# Patient Record
Sex: Female | Born: 1970 | Race: Black or African American | Hispanic: No | Smoking: Current every day smoker
Health system: Southern US, Community
[De-identification: ages and names within clinical notes are randomized; demographics above are authoritative.]

## PROBLEM LIST (undated history)

## (undated) DIAGNOSIS — T7840XA Allergy, unspecified, initial encounter: Secondary | ICD-10-CM

## (undated) DIAGNOSIS — J302 Other seasonal allergic rhinitis: Secondary | ICD-10-CM

## (undated) HISTORY — PX: PILONIDAL CYST EXCISION: SHX744

## (undated) HISTORY — PX: WISDOM TOOTH EXTRACTION: SHX21

## (undated) HISTORY — PX: CHOLECYSTECTOMY: SHX55

## (undated) HISTORY — DX: Allergy, unspecified, initial encounter: T78.40XA

---

## 1999-05-17 ENCOUNTER — Other Ambulatory Visit: Admission: RE | Admit: 1999-05-17 | Discharge: 1999-05-17 | Payer: Self-pay | Admitting: Gynecology

## 1999-05-23 ENCOUNTER — Encounter: Payer: Self-pay | Admitting: Obstetrics and Gynecology

## 1999-05-23 ENCOUNTER — Ambulatory Visit (HOSPITAL_COMMUNITY): Admission: RE | Admit: 1999-05-23 | Discharge: 1999-05-23 | Payer: Self-pay | Admitting: Obstetrics and Gynecology

## 1999-10-02 ENCOUNTER — Ambulatory Visit (HOSPITAL_COMMUNITY): Admission: RE | Admit: 1999-10-02 | Discharge: 1999-10-02 | Payer: Self-pay | Admitting: Obstetrics and Gynecology

## 2000-01-08 ENCOUNTER — Inpatient Hospital Stay (HOSPITAL_COMMUNITY): Admission: AD | Admit: 2000-01-08 | Discharge: 2000-01-11 | Payer: Self-pay | Admitting: Obstetrics & Gynecology

## 2000-01-12 ENCOUNTER — Encounter: Admission: RE | Admit: 2000-01-12 | Discharge: 2000-04-11 | Payer: Self-pay | Admitting: Obstetrics & Gynecology

## 2000-04-13 ENCOUNTER — Encounter: Admission: RE | Admit: 2000-04-13 | Discharge: 2000-06-12 | Payer: Self-pay | Admitting: Obstetrics & Gynecology

## 2000-07-11 ENCOUNTER — Encounter: Admission: RE | Admit: 2000-07-11 | Discharge: 2000-08-10 | Payer: Self-pay | Admitting: Obstetrics & Gynecology

## 2000-08-11 ENCOUNTER — Encounter: Admission: RE | Admit: 2000-08-11 | Discharge: 2000-09-10 | Payer: Self-pay | Admitting: Obstetrics & Gynecology

## 2000-10-11 ENCOUNTER — Encounter: Admission: RE | Admit: 2000-10-11 | Discharge: 2000-11-10 | Payer: Self-pay | Admitting: Obstetrics & Gynecology

## 2000-11-11 ENCOUNTER — Encounter: Admission: RE | Admit: 2000-11-11 | Discharge: 2000-12-11 | Payer: Self-pay | Admitting: Obstetrics & Gynecology

## 2003-06-06 ENCOUNTER — Other Ambulatory Visit: Admission: RE | Admit: 2003-06-06 | Discharge: 2003-06-06 | Payer: Self-pay | Admitting: Family Medicine

## 2003-06-16 ENCOUNTER — Ambulatory Visit (HOSPITAL_COMMUNITY): Admission: RE | Admit: 2003-06-16 | Discharge: 2003-06-16 | Payer: Self-pay | Admitting: General Surgery

## 2003-06-16 ENCOUNTER — Encounter (INDEPENDENT_AMBULATORY_CARE_PROVIDER_SITE_OTHER): Payer: Self-pay | Admitting: *Deleted

## 2005-09-25 ENCOUNTER — Other Ambulatory Visit: Admission: RE | Admit: 2005-09-25 | Discharge: 2005-09-25 | Payer: Self-pay | Admitting: Family Medicine

## 2008-09-22 ENCOUNTER — Emergency Department (HOSPITAL_COMMUNITY): Admission: EM | Admit: 2008-09-22 | Discharge: 2008-09-22 | Payer: Self-pay | Admitting: Emergency Medicine

## 2010-03-17 ENCOUNTER — Emergency Department (HOSPITAL_COMMUNITY)
Admission: EM | Admit: 2010-03-17 | Discharge: 2010-03-17 | Payer: Self-pay | Source: Home / Self Care | Admitting: Family Medicine

## 2010-03-25 LAB — POCT URINALYSIS DIPSTICK
Bilirubin Urine: NEGATIVE
Ketones, ur: NEGATIVE mg/dL
Nitrite: NEGATIVE
Protein, ur: NEGATIVE mg/dL
Specific Gravity, Urine: 1.025 (ref 1.005–1.030)
Urine Glucose, Fasting: NEGATIVE mg/dL
Urobilinogen, UA: 0.2 mg/dL (ref 0.0–1.0)
pH: 6.5 (ref 5.0–8.0)

## 2010-03-25 LAB — URINE CULTURE
Colony Count: 3000
Culture  Setup Time: 201201082208

## 2010-03-25 LAB — POCT PREGNANCY, URINE: Preg Test, Ur: NEGATIVE

## 2010-03-26 ENCOUNTER — Emergency Department (HOSPITAL_COMMUNITY)
Admission: EM | Admit: 2010-03-26 | Discharge: 2010-03-26 | Payer: Self-pay | Source: Home / Self Care | Admitting: Family Medicine

## 2010-04-01 LAB — CULTURE, ROUTINE-ABSCESS

## 2010-06-10 ENCOUNTER — Observation Stay (HOSPITAL_COMMUNITY)
Admission: EM | Admit: 2010-06-10 | Discharge: 2010-06-13 | Disposition: A | Discharge status: Home / Self Care | Payer: BC Managed Care – PPO | Attending: General Surgery | Admitting: General Surgery

## 2010-06-10 ENCOUNTER — Emergency Department (HOSPITAL_COMMUNITY): Payer: BC Managed Care – PPO

## 2010-06-10 DIAGNOSIS — Z79899 Other long term (current) drug therapy: Secondary | ICD-10-CM | POA: Insufficient documentation

## 2010-06-10 DIAGNOSIS — E669 Obesity, unspecified: Secondary | ICD-10-CM | POA: Insufficient documentation

## 2010-06-10 DIAGNOSIS — F17200 Nicotine dependence, unspecified, uncomplicated: Secondary | ICD-10-CM | POA: Insufficient documentation

## 2010-06-10 DIAGNOSIS — Z23 Encounter for immunization: Secondary | ICD-10-CM | POA: Insufficient documentation

## 2010-06-10 DIAGNOSIS — K8010 Calculus of gallbladder with chronic cholecystitis without obstruction: Principal | ICD-10-CM | POA: Insufficient documentation

## 2010-06-10 LAB — URINALYSIS, ROUTINE W REFLEX MICROSCOPIC
Bilirubin Urine: NEGATIVE
Glucose, UA: NEGATIVE mg/dL
Ketones, ur: NEGATIVE mg/dL
Leukocytes, UA: NEGATIVE
Nitrite: NEGATIVE
Protein, ur: NEGATIVE mg/dL
Specific Gravity, Urine: 1.021 (ref 1.005–1.030)
Urobilinogen, UA: 0.2 mg/dL (ref 0.0–1.0)
pH: 7 (ref 5.0–8.0)

## 2010-06-10 LAB — COMPREHENSIVE METABOLIC PANEL
ALT: 9 U/L (ref 0–35)
AST: 15 U/L (ref 0–37)
Albumin: 3.5 g/dL (ref 3.5–5.2)
Alkaline Phosphatase: 55 U/L (ref 39–117)
BUN: 7 mg/dL (ref 6–23)
CO2: 29 mEq/L (ref 19–32)
Calcium: 8.9 mg/dL (ref 8.4–10.5)
Chloride: 101 mEq/L (ref 96–112)
Creatinine, Ser: 0.86 mg/dL (ref 0.4–1.2)
GFR calc Af Amer: >60 mL/min (ref >60)
GFR calc non Af Amer: >60 mL/min (ref >60)
Glucose, Bld: 94 mg/dL (ref 70–99)
Potassium: 3.8 mEq/L (ref 3.5–5.1)
Sodium: 138 mEq/L (ref 135–145)
Total Bilirubin: 0.5 mg/dL (ref 0.3–1.2)
Total Protein: 7.5 g/dL (ref 6.0–8.3)

## 2010-06-10 LAB — DIFFERENTIAL
Basophils Absolute: 0 10*3/uL (ref 0.0–0.1)
Basophils Relative: 1 % (ref 0–1)
Eosinophils Absolute: 0.2 10*3/uL (ref 0.0–0.7)
Eosinophils Relative: 2 % (ref 0–5)
Lymphocytes Relative: 36 % (ref 12–46)
Lymphs Abs: 3.1 10*3/uL (ref 0.7–4.0)
Monocytes Absolute: 0.6 10*3/uL (ref 0.1–1.0)
Monocytes Relative: 7 % (ref 3–12)
Neutro Abs: 4.8 10*3/uL (ref 1.7–7.7)
Neutrophils Relative %: 55 % (ref 43–77)

## 2010-06-10 LAB — CBC
HCT: 41.2 % (ref 36.0–46.0)
Hemoglobin: 14.2 g/dL (ref 12.0–15.0)
MCH: 29.2 pg (ref 26.0–34.0)
MCHC: 34.5 g/dL (ref 30.0–36.0)
MCV: 84.6 fL (ref 78.0–100.0)
Platelets: 380 10*3/uL (ref 150–400)
RBC: 4.87 MIL/uL (ref 3.87–5.11)
RDW: 12.2 % (ref 11.5–15.5)
WBC: 8.7 10*3/uL (ref 4.0–10.5)

## 2010-06-10 LAB — LIPASE, BLOOD: Lipase: 47 U/L (ref 11–59)

## 2010-06-10 LAB — URINE MICROSCOPIC-ADD ON

## 2010-06-10 LAB — POCT PREGNANCY, URINE: Preg Test, Ur: NEGATIVE

## 2010-06-12 ENCOUNTER — Other Ambulatory Visit: Payer: Self-pay | Admitting: General Surgery

## 2010-06-12 ENCOUNTER — Observation Stay (HOSPITAL_COMMUNITY): Payer: BC Managed Care – PPO

## 2010-06-12 LAB — SURGICAL PCR SCREEN
MRSA, PCR: NEGATIVE
Staphylococcus aureus: POSITIVE — AB

## 2010-06-15 NOTE — Op Note (Signed)
Lorraine [name], [name]                ACCOUNT NO.:  [id number]  MEDICAL RECORD NO.:  [id number]           PATIENT TYPE:  O  LOCATION:  5120                         FACILITY:  [facility]  PHYSICIAN:  Lorraine [name], MDDATE OF BIRTH:  [birth year]  DATE OF PROCEDURE:  06/12/2010 DATE OF DISCHARGE:                              OPERATIVE REPORT   PREOPERATIVE DIAGNOSIS:  Biliary colic.  POSTOPERATIVE DIAGNOSIS:  Chronic cholecystitis.  PROCEDURE:  Laparoscopic cholecystectomy and intraoperative cholangiogram.  SURGEON:  Lorraine Gosling, MD  ASSISTANT:  Brayton El, PA-C  ANESTHESIA:  General.  SPECIMENS:  Gallbladder and contents to pathology.  ESTIMATED BLOOD LOSS:  Minimal.  COMPLICATIONS:  None.  DRAINS:  None.  DISPOSITION:  To recovery room in stable condition.  INDICATIONS:  This is a 40 year old female with right upper quadrant pain over the last couple of [place].  Her liver LFTs and lipase were both normal.  She has an ultrasound that shows a stone impacted in her neck. We discussed laparoscopic cholecystectomy.  PROCEDURE:  After informed consent was obtained, the patient was placed on ciprofloxacin on the floor due to penicillin allergy.  She had sequential compression devices placed on lower extremities prior to induction of anesthesia.  She was then placed under general endotracheal anesthesia without complication.  Her abdomen was prepped and draped in standard sterile surgical fashion.  Surgical time-out was then performed.  Marcaine 0.25% was infiltrated below the umbilicus.  I then made a vertical incision and dissected down to the umbilical stock.  This was grasped.  I then entered the fascia sharply.  The peritoneum was entered bluntly.  There was no evidence of an entry injury.  I then placed a 0- Vicryl pursestring suture through the fascia.  I then insufflated the abdomen at 15 mmHg.  The three further 5-mm trocars were placed in the epigastrium  right upper quadrant under direct vision after infiltration of local anesthetic without complication.  Gallbladder was then retracted cephalad and lateral.  There was a fair amount of scarring around her neck of her gallbladder where the stone was impacted.  With careful dissection, I eventually was able to identify what I thought was the cystic duct.  The right hepatic artery had a caterpillar hump and an anatomical variant, it was adherent to both the gallbladder as well as the cystic duct.  I eventually was able to dissect the cystic duct from this.  Before I decided to clip and divide this, I did perform a cholangiogram.  I placed a clip distal, introduced a Cook catheter, and then did a cholangiogram.  This showed that I was in the cystic duct, it showed filling of both sides of the liver and flow into the duodenum. Following this, I removed the catheter, clipped the duct and divided it. With some additional work, I then was able to eventually free the right hepatic artery from the gallbladder and was able to identify the cystic artery coming right off the right hepatic artery.  This was ligated with clips and divided.  Then the gallbladder was removed from the liver bed. There was evidence of  chronic and then some acute cholecystitis with removing this with a very thick rind around the gallbladder and a fairly watery plane with the liver.  This eventually was removed, placed in an EndoCatch bag, and removed from the umbilicus.  Hemostasis was then obtained.  Copious irrigation was performed.  I placed a piece of Surgicel snow in the bed.  Irrigant was evacuated.  I then observed the umbilical incision from the epigastric port.  This was tied down, the defect appeared to be obliterated, all the trocars were removed and the abdomen was desufflated.  The incisions were then closed with 4-0 Monocryl.  Dermabond was placed over this.  She tolerated this well, was extubated in the  operating room, and transferred to the recovery room in a stable condition.     Lorraine Gosling, MD     MCW/MEDQ  D:  06/12/2010  T:  06/13/2010  Job:  [id number]  Electronically Signed by Emelia Loron MD on 06/15/2010 09:33:38 AM

## 2010-06-24 NOTE — H&P (Signed)
Lorraine [name], Lorraine [name]                ACCOUNT NO.:  [id number]  MEDICAL RECORD NO.:  [id number]           PATIENT TYPE:  O  LOCATION:  5120                         FACILITY:  [facility]  PHYSICIAN:  Juanetta Gosling, MDDATE OF BIRTH:  [birth year]  DATE OF ADMISSION:  06/10/2010 DATE OF DISCHARGE:                             HISTORY & PHYSICAL   TIME OF ADMISSION:  1330 p.m.  CHIEF COMPLAINT:  Abdominal pain.  HISTORY OF PRESENT ILLNESS:  Ms. Muldrow is a very pleasant 40 year old obese black female who has no past medical history.  She developed epigastric abdominal pain yesterday morning.  Throughout the day, it faded and went away.  However, last night, it came back.  She describes this as epigastric, nagging, dull, and constant pain.  She did have some nausea yesterday, but no emesis until this morning.  She denies any fevers or chills.  She denies any prior episodes.  Because of worsening pain, which has now resolved, the patient presented to the emergency department.  Upon arrival, she had a ultrasound of abdomen completed which revealed gallbladder wall thickening that was edematous and measuring 5.4 mm.  She also had a stone lodged in the neck of her gallbladder that measured 1.8 cm.  Because of this, we were asked to evaluate the patient for admission.  FAMILY HISTORY:  Noncontributory.  PAST MEDICAL HISTORY:  Obesity.  PAST SURGICAL HISTORY:  One pilonidal cyst removal.  SOCIAL HISTORY:  The patient is single.  She has one child.  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  She admits to 6-7 cigarettes a day, social alcohol, but no illicit drug abuse.  ALLERGIES:  PENICILLIN.  MEDICATIONS: 1. Phentermine 37.5 mg half a tablet daily. 2. Forskohlii. 3. Raspberry ketone. 4. Fexofenadine. 5. Multivitamin.  PHYSICAL EXAM:  GENERAL:  Ms. Rochon is a pleasant obese 40 year old black female who is currently sitting up in bed in no acute distress. VITAL SIGNS:   Temperature 98.6, pulse 85, respirations 20, blood pressure 116/78. HEENT:  Head is normocephalic, atraumatic.  Sclerae noninjected.  Pupils are equal, round, and reactive to light.  Ears and nose without any obvious masses or lesions.  No rhinorrhea.  Mouth is pink.  Throat shows no exudate. HEART:  Regular rate and rhythm.  Normal S1, S2.  No murmurs, gallops, or rubs are noted.  She does have palpable carotid, radial, and pedal pulses bilaterally. LUNGS:  Clear to auscultation bilaterally with no wheezes, rhonchi, or rales noted.  Respiratory effort is nonlabored. ABDOMEN:  Soft, nontender, nondistended with active bowel sounds.  No masses, hernias, or organomegaly noted.  The patient is obese. MUSCULOSKELETAL:  All four extremities are symmetrical.  No cyanosis, clubbing, or edema. PSYCH:  The patient is alert and oriented x3 with an appropriate affect.  LABS AND DIAGNOSTICS:  White blood cell count is 8700, hemoglobin 14.2, hematocrit 41.2, platelet count is 380,000.  Sodium 138, potassium 3.8, glucose 94, BUN 7, creatinine 0.86, AST 18, ALT 5, alkaline phosphatase 55, total bilirubin 0.5, lipase is 47.  Ultrasound reveals a 1.8 cm stone lodged in the neck of  the gallbladder.  She has gallbladder wall thickening and edema that is 5.4 mm.  IMPRESSION: 1. Biliary colic/early acute cholecystitis. 2. Obesity.  PLAN:  At this time, we will plan on admitting the patient and proceeding with a laparoscopic cholecystectomy pending no other emergency cases.  We will give her Cipro on-call to the OR.  We will make her n.p.o. and start her on various IV fluids and medicines as needed for pain and nausea.  I have discussed the procedure along with risks and benefits of the surgery to the patient.  She understands and wishes to proceed.     Letha Cape, PA   ______________________________ Juanetta Gosling, MD    KEO/MEDQ  D:  06/10/2010  T:  06/11/2010  Job:   [id number]  Electronically Signed by Barnetta Chapel PA on 06/21/2010 01:05:18 PM Electronically Signed by Emelia Loron MD on 06/23/2010 08:22:15 PM

## 2010-07-08 ENCOUNTER — Encounter (INDEPENDENT_AMBULATORY_CARE_PROVIDER_SITE_OTHER): Payer: Self-pay | Admitting: General Surgery

## 2010-07-11 NOTE — Discharge Summary (Signed)
  Lorraine [name]                ACCOUNT NO.:  [id number]  MEDICAL RECORD NO.:  [id number]           PATIENT TYPE:  O  LOCATION:  5120                         FACILITY:  [facility]  PHYSICIAN:  Lorraine [name], MDDATE OF BIRTH:  07-03-1970  DATE OF ADMISSION:  06/10/2010 DATE OF DISCHARGE:  06/13/2010                              DISCHARGE SUMMARY   HISTORY OF PRESENT ILLNESS:  Lorraine [name] is a 40 year old obese black female who presented with epigastric pain.  She was seen in the emergency department and had a workup including ultrasound and labs. Her ultrasound revealed a large stone lodged in the neck of the gallbladder and mild gallbladder wall thickening consistent with early cholecystitis.  Decision was made at that point to admit the patient for ongoing management.  SUMMARY OF HOSPITAL COURSE:  The patient was admitted on June 10, 2010, was then subsequently taken to the operating room on June 12, 2010, and underwent laparoscopic cholecystectomy with intraoperative cholangiogram, which was negative.  The patient tolerated the procedure well and was stable for discharge.  No significant postoperative complications occurred.  The patient tolerated regular diet and as of June 13, 2010, was ready for discharge home.  DISCHARGE DIAGNOSIS:  Acute cholecystitis status post laparoscopic cholecystectomy.  PLAN:  Home Health.  MEDICATIONS:  The patient will continue her phentermine and multivitamins and supplements.  She will also be given prescription for Vicodin 1-2 tablets q.6 h. p.r.n. pain.  She is given preprinted discharge instructions to follow and come back in our office in approximately 2 [place]' time.     Lorraine El, PA-C   ______________________________ Lorraine Gosling, MD    KB/MEDQ  D:  07/01/2010  T:  07/02/2010  Job:  [id number]  Electronically Signed by Lorraine [name]  on 07/11/2010 09:04:26 AM Electronically Signed by Emelia Loron MD on  07/11/2010 07:09:43 PM

## 2010-07-26 NOTE — Op Note (Signed)
NAMEBAYLA, MCGOVERN                            ACCOUNT NO.:  [id number]   MEDICAL RECORD NO.:  [id number]                   PATIENT TYPE:  OIB   LOCATION:  2899                                 FACILITY:  [facility]   PHYSICIAN:  Leonie Man, M.D.                DATE OF BIRTH:  [birth year]   DATE OF PROCEDURE:  06/16/2003  DATE OF DISCHARGE:                                 OPERATIVE REPORT   PREOPERATIVE DIAGNOSIS:  Pilonidal cyst and abscess.   POSTOPERATIVE DIAGNOSIS:  Pilonidal cyst and abscess.   PROCEDURE:  Excision and marsupialization of pilonidal cyst.   SURGEON:  Leonie Man, M.D.   ASSISTANT:  Nurse.   ANESTHESIA:  General.   The patient is a 40 year old morbidly obese female with recurrent episodes  of infection through a pilonidal cyst.  She has a chronic draining sinus.  She comes to the operating room after the risks and potential benefits of  surgery have been fully discussed, all questions answered, and consent  obtained.   PROCEDURE:  Following the induction of anesthesia, the patient is placed in  the prone position, the buttock cheeks are spread, and the area of the lower  lumbar and coccygeal area is prepped and draped to be included in a sterile  operative field.  The area of induration and cyst extended approximately 4  cm across transversely and extended down to near to the tip of the coccyx.  A long elliptical incision incorporating all this was carried down through  the skin and subcutaneous tissues, carrying it down to the presacral fascia.  The entire area was removed and forwarded for pathologic evaluation.  Hemostasis was assured with electric cautery.  The dermis was then tacked  down to the presacral fascia with interrupted sutures of 0 chromic catgut.  The wound was then packed with Xeroform gauze, sterile dressings were  applied, the sponge, instrument, and sharp counts verified, the patient  removed from the operating room to the recovery room  in stable condition.  She tolerated the procedure well.                                               Leonie Man, M.D.    PB/MEDQ  D:  06/16/2003  T:  06/16/2003  Job:  [id number]   cc:   Renaye Rakers, M.D.  [phone] [address], [address]  [place]  [place] [address]  Fax: [phone]

## 2011-04-01 ENCOUNTER — Encounter: Payer: Self-pay | Admitting: Family Medicine

## 2011-04-01 ENCOUNTER — Ambulatory Visit (INDEPENDENT_AMBULATORY_CARE_PROVIDER_SITE_OTHER): Payer: BC Managed Care – PPO | Admitting: Family Medicine

## 2011-04-01 ENCOUNTER — Ambulatory Visit: Payer: Self-pay | Admitting: Family Medicine

## 2011-04-01 VITALS — BP 110/72 | HR 87 | Resp 16 | Ht 68.5 in | Wt 309.1 lb

## 2011-04-01 DIAGNOSIS — Z131 Encounter for screening for diabetes mellitus: Secondary | ICD-10-CM

## 2011-04-01 DIAGNOSIS — Z720 Tobacco use: Secondary | ICD-10-CM

## 2011-04-01 DIAGNOSIS — L732 Hidradenitis suppurativa: Secondary | ICD-10-CM

## 2011-04-01 DIAGNOSIS — E669 Obesity, unspecified: Secondary | ICD-10-CM

## 2011-04-01 DIAGNOSIS — L0292 Furuncle, unspecified: Secondary | ICD-10-CM

## 2011-04-01 DIAGNOSIS — L0293 Carbuncle, unspecified: Secondary | ICD-10-CM

## 2011-04-01 DIAGNOSIS — Z13220 Encounter for screening for lipoid disorders: Secondary | ICD-10-CM

## 2011-04-01 DIAGNOSIS — F17200 Nicotine dependence, unspecified, uncomplicated: Secondary | ICD-10-CM

## 2011-04-01 NOTE — Patient Instructions (Signed)
Set an appt for your PAP Smear Get your labs done before the next visit- fasting Work on your work out plan Try veggies/fruit with each meal Eat at least 3 meals a day, with snacks in between Use the electronic cigarrette

## 2011-04-01 NOTE — Progress Notes (Signed)
  Subjective:    Patient ID: Lorraine [name], female    DOB: [birth year], 41 y.o.   MRN: [id number]  HPI   Pt here to establish care , last PCP Dr. Valentina Lucks at [facility] in [place]   Recent Cholecystecomy in April 2012- no current abdominal pain    No history of HTN, DM, Hyperlipidemia, Migraine, lung disease   Recurrent Boils- occur in axilla, groin, breast and on abdomen     Right breast abscess- Feb 2012 treated at urgernt care, continues to have discharge at the site of the abscess that is thick intermittanly, no nipple discharge  No recent PAP Smear- no history of abnormal PAP smears  LMP- Mar 08, 2011, regular    No eye doctor or dentist   No flu shot  +Tetanus shots -- 2012  Review of Systems GEN- denies fatigue, fever, weight loss,weakness, recent illness HEENT- denies eye drainage, change in vision, nasal discharge, CVS- denies chest pain, palpitations RESP- denies SOB, cough, wheeze ABD- denies N/V, loose stools, abd pain, +cramping GU- denies dysuria, hematuria, dribbling, incontinence MSK- denies joint pain, muscle aches, injury Neuro- denies headache, dizziness, syncope, seizure activity          Objective:   Physical Exam GEN- NAD, alert and oriented x3, obese HEENT- PERRL, EOMI, non injected sclera, pink conjunctiva, MMM, oropharynx clear Neck- Supple, no thryomegaly CVS- RRR, no murmur ABD- NABS, soft , NT, ND RESP-CTAB EXT- No edema Pulses- Radial, DP- 2+ Skin- healing boil in axilla, open pit in left axilla- no pus expressed, multiple scars on abdomen , chest wall from boils  Left axilla- pit seen      Assessment & Plan:

## 2011-04-02 DIAGNOSIS — L0293 Carbuncle, unspecified: Secondary | ICD-10-CM | POA: Insufficient documentation

## 2011-04-02 DIAGNOSIS — E669 Obesity, unspecified: Secondary | ICD-10-CM | POA: Insufficient documentation

## 2011-04-02 DIAGNOSIS — Z720 Tobacco use: Secondary | ICD-10-CM | POA: Insufficient documentation

## 2011-04-02 DIAGNOSIS — E66813 Obesity, class 3: Secondary | ICD-10-CM

## 2011-04-02 DIAGNOSIS — L732 Hidradenitis suppurativa: Secondary | ICD-10-CM

## 2011-04-02 HISTORY — DX: Hidradenitis suppurativa: L73.2

## 2011-04-02 HISTORY — DX: Obesity, class 3: E66.813

## 2011-04-02 NOTE — Assessment & Plan Note (Signed)
Discussed need for weight loss, higher risk for co-morbidities. Will screen for lipids, DM

## 2011-04-02 NOTE — Assessment & Plan Note (Signed)
Screen lipids, follow up for CPE

## 2011-04-02 NOTE — Assessment & Plan Note (Addendum)
Counseled to quit Pt to try electronic cigarette

## 2011-04-02 NOTE — Assessment & Plan Note (Signed)
Noted pit in left axilla, if this worsens with recurrent infections will refer to general surgery

## 2011-04-02 NOTE — Assessment & Plan Note (Signed)
No current active lesions, pt will likely have many recurrent lesions. Based on exam I would treat via phone if needed

## 2011-04-02 NOTE — Assessment & Plan Note (Signed)
Obtain A1C. 

## 2011-07-10 ENCOUNTER — Telehealth: Payer: Self-pay | Admitting: Family Medicine

## 2011-07-10 DIAGNOSIS — Z13220 Encounter for screening for lipoid disorders: Secondary | ICD-10-CM

## 2011-07-10 DIAGNOSIS — Z131 Encounter for screening for diabetes mellitus: Secondary | ICD-10-CM

## 2011-07-10 DIAGNOSIS — L0293 Carbuncle, unspecified: Secondary | ICD-10-CM

## 2011-07-10 NOTE — Telephone Encounter (Signed)
Labs reordered and faxed to [facility], patient aware

## 2011-07-31 LAB — BASIC METABOLIC PANEL
BUN: 7 mg/dL (ref 6–23)
CO2: 28 mEq/L (ref 19–32)
Calcium: 9.2 mg/dL (ref 8.4–10.5)
Chloride: 104 mEq/L (ref 96–112)
Creat: 0.73 mg/dL (ref 0.50–1.10)
Glucose, Bld: 79 mg/dL (ref 70–99)
Potassium: 4.2 mEq/L (ref 3.5–5.3)
Sodium: 138 mEq/L (ref 135–145)

## 2011-07-31 LAB — LIPID PANEL
Cholesterol: 150 mg/dL (ref 0–200)
HDL: 49 mg/dL (ref >39)
LDL Cholesterol: 90 mg/dL (ref 0–99)
Total CHOL/HDL Ratio: 3.1 Ratio
Triglycerides: 55 mg/dL (ref <150)
VLDL: 11 mg/dL (ref 0–40)

## 2011-08-01 LAB — HEMOGLOBIN A1C
Hgb A1c MFr Bld: 5.5 % (ref <5.7)
Mean Plasma Glucose: 111 mg/dL (ref <117)

## 2011-08-01 LAB — CBC WITH DIFFERENTIAL/PLATELET
Basophils Absolute: 0 10*3/uL (ref 0.0–0.1)
Basophils Relative: 1 % (ref 0–1)
Eosinophils Absolute: 0.2 10*3/uL (ref 0.0–0.7)
Eosinophils Relative: 4 % (ref 0–5)
HCT: 38.1 % (ref 36.0–46.0)
Hemoglobin: 12.7 g/dL (ref 12.0–15.0)
Lymphocytes Relative: 53 % — ABNORMAL HIGH (ref 12–46)
Lymphs Abs: 3.2 10*3/uL (ref 0.7–4.0)
MCH: 28 pg (ref 26.0–34.0)
MCHC: 33.3 g/dL (ref 30.0–36.0)
MCV: 84.1 fL (ref 78.0–100.0)
Monocytes Absolute: 0.4 10*3/uL (ref 0.1–1.0)
Monocytes Relative: 7 % (ref 3–12)
Neutro Abs: 2.2 10*3/uL (ref 1.7–7.7)
Neutrophils Relative %: 35 % — ABNORMAL LOW (ref 43–77)
Platelets: 361 10*3/uL (ref 150–400)
RBC: 4.53 MIL/uL (ref 3.87–5.11)
RDW: 13.3 % (ref 11.5–15.5)
WBC: 6 10*3/uL (ref 4.0–10.5)

## 2011-08-01 LAB — TSH: TSH: 0.668 u[IU]/mL (ref 0.350–4.500)

## 2011-12-21 ENCOUNTER — Emergency Department (HOSPITAL_COMMUNITY)
Admission: EM | Admit: 2011-12-21 | Discharge: 2011-12-21 | Disposition: A | Discharge status: Home / Self Care | Payer: BC Managed Care – PPO | Source: Home / Self Care | Attending: Family Medicine | Admitting: Family Medicine

## 2011-12-21 ENCOUNTER — Encounter (HOSPITAL_COMMUNITY): Payer: Self-pay | Admitting: Emergency Medicine

## 2011-12-21 DIAGNOSIS — M7072 Other bursitis of hip, left hip: Secondary | ICD-10-CM

## 2011-12-21 DIAGNOSIS — M76899 Other specified enthesopathies of unspecified lower limb, excluding foot: Secondary | ICD-10-CM

## 2011-12-21 MED ORDER — DICLOFENAC POTASSIUM 50 MG PO TABS: 50.0000 mg | ORAL_TABLET | Freq: Three times a day (TID) | ORAL | Status: DC

## 2011-12-21 MED ORDER — KETOROLAC TROMETHAMINE 60 MG/2ML IM SOLN
60.0000 mg | Freq: Once | INTRAMUSCULAR | Status: AC
  Administered 2011-12-21: 60 mg via INTRAMUSCULAR

## 2011-12-21 MED ORDER — KETOROLAC TROMETHAMINE 60 MG/2ML IM SOLN
INTRAMUSCULAR | Status: AC
  Filled 2011-12-21: qty 2

## 2011-12-21 NOTE — ED Provider Notes (Signed)
History     CSN: [id number]  Arrival date & time 12/21/11  1308   First MD Initiated Contact with Patient 12/21/11 1319      Chief Complaint  Patient presents with  . Hip Pain    (Consider location/radiation/quality/duration/timing/severity/associated sxs/prior treatment) Patient is a 41 y.o. female presenting with hip pain. The history is provided by the patient.  Hip Pain This is a new problem. The current episode started more than 2 days ago. 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    Past Medical History  Diagnosis Date  . Allergy     Seasonal    Past Surgical History  Procedure Date  . Cholecystectomy   . Pilonidal cyst excision     Family History  Problem Relation Age of Onset  . Heart disease Father   . Diabetes Father   . Thyroid disease Mother     Pituitary tumor  . Diabetes Paternal Aunt   . Diabetes Paternal Uncle   . Diabetes Maternal Grandmother     History  Substance Use Topics  . Smoking status: Current Every Day Smoker -- 0.5 packs/day  . Smokeless tobacco: Not on file  . Alcohol Use: Yes     SOCIALLY (TWICE A MONTH)    OB History    Grav Para Term Preterm Abortions TAB SAB Ect Mult Living                  Review of Systems  Constitutional: Negative.   Gastrointestinal: Negative.   Genitourinary: Negative.   Musculoskeletal: Negative for joint swelling and gait problem.    Allergies  Penicillins  Home Medications   Current Outpatient Rx  Name Route Sig Dispense Refill  . DICLOFENAC POTASSIUM 50 MG PO TABS Oral Take 1 tablet (50 mg total) by mouth 3 (three) times daily. 30 tablet 0  . FORSKOLIN POWD Does not apply by Does not apply route. supplement     . MULTIVITAMIN PO Oral Take by mouth. Natures made women's mvi     . RASPBERRY PO Oral Take by mouth. Raspberry ketones supplement       BP 132/72  Pulse 75  Temp 98.2 F (36.8 C) (Oral)  Resp 18   SpO2 100%  LMP 11/28/2011  Physical Exam  Nursing note and vitals reviewed. Constitutional: She is oriented to person, place, and time. She appears well-developed and well-nourished.  Abdominal: Soft. Bowel sounds are normal. She exhibits no distension and no mass. There is no tenderness. There is no rebound and no guarding.  Musculoskeletal:       Left lat gluteal, greater troch soreness to deep palpation, no pain with hip rom, no radicular pain,.  Neurological: She is alert and oriented to person, place, and time.  Skin: Skin is warm and dry.    ED Course  Procedures (including critical care time)  Labs Reviewed - No data to display No results found.   1. Hip bursitis, left       MDM          Linna Hoff, MD 12/21/11 1359

## 2011-12-21 NOTE — ED Notes (Signed)
Pt c/o hip pain located in the upper outer quad of buttocks. Pain is sharp and causing cramps in legs. Symptoms have been constant since Thursday.  Pt has tried ibuprofen with some relief.

## 2012-03-23 ENCOUNTER — Telehealth: Payer: Self-pay | Admitting: Family Medicine

## 2012-03-23 NOTE — Telephone Encounter (Signed)
She needs to be evaluated, she can use warm compresses and dial soap for now, I suggest urgent care

## 2012-03-23 NOTE — Telephone Encounter (Signed)
Please advise 

## 2012-03-26 NOTE — Telephone Encounter (Signed)
Patient states that boil has now drained.

## 2012-04-05 ENCOUNTER — Ambulatory Visit (INDEPENDENT_AMBULATORY_CARE_PROVIDER_SITE_OTHER): Payer: BC Managed Care – PPO | Admitting: Family Medicine

## 2012-04-05 ENCOUNTER — Other Ambulatory Visit (HOSPITAL_COMMUNITY)
Admission: RE | Admit: 2012-04-05 | Discharge: 2012-04-05 | Disposition: A | Discharge status: Home / Self Care | Payer: BC Managed Care – PPO | Source: Ambulatory Visit | Attending: Family Medicine | Admitting: Family Medicine

## 2012-04-05 ENCOUNTER — Encounter: Payer: Self-pay | Admitting: Family Medicine

## 2012-04-05 VITALS — BP 126/78 | HR 78 | Resp 18 | Ht 68.5 in | Wt 306.1 lb

## 2012-04-05 DIAGNOSIS — Z720 Tobacco use: Secondary | ICD-10-CM

## 2012-04-05 DIAGNOSIS — N760 Acute vaginitis: Secondary | ICD-10-CM

## 2012-04-05 DIAGNOSIS — Z113 Encounter for screening for infections with a predominantly sexual mode of transmission: Secondary | ICD-10-CM | POA: Insufficient documentation

## 2012-04-05 DIAGNOSIS — Z01419 Encounter for gynecological examination (general) (routine) without abnormal findings: Secondary | ICD-10-CM | POA: Insufficient documentation

## 2012-04-05 DIAGNOSIS — F17200 Nicotine dependence, unspecified, uncomplicated: Secondary | ICD-10-CM

## 2012-04-05 NOTE — Patient Instructions (Addendum)
I recommend eye visit once a year I recommend dental visit every 6 months Goal is to  Exercise 30 minutes 5 days a week We will send a letter with lab results  Keep up with the Wii Fit,  Diet- see handout on foods to eat, low fat, low carb F/U 1 year as needed

## 2012-04-07 ENCOUNTER — Encounter: Payer: Self-pay | Admitting: Family Medicine

## 2012-04-07 DIAGNOSIS — Z309 Encounter for contraceptive management, unspecified: Secondary | ICD-10-CM | POA: Insufficient documentation

## 2012-04-07 DIAGNOSIS — N760 Acute vaginitis: Secondary | ICD-10-CM | POA: Insufficient documentation

## 2012-04-07 MED ORDER — METRONIDAZOLE 500 MG PO TABS: 500.0000 mg | ORAL_TABLET | Freq: Two times a day (BID) | ORAL | Status: DC

## 2012-04-07 NOTE — Assessment & Plan Note (Signed)
Discussed diet and exercise, she has had little motivation for change

## 2012-04-07 NOTE — Assessment & Plan Note (Signed)
Counseled on cessation, she wants to try her electronic cigarette again

## 2012-04-07 NOTE — Progress Notes (Signed)
  Subjective:    Patient ID: Lorraine [name], female    DOB: 1970-07-04, 42 y.o.   MRN: [id number]  HPI  Pt here for GYN exam, has had vaginal discharge with fishy odor x 2 [place], no abd pain, no fever Currently on menses x 3 days In intermin seen by UC due to hip pain diagnosed with bursitis Medications reviewed   Review of Systems  GEN- denies fatigue, fever, weight loss,weakness, recent illness HEENT- denies eye drainage, change in vision, nasal discharge, CVS- denies chest pain, palpitations RESP- denies SOB, cough, wheeze ABD- denies N/V, change in stools, abd pain GU- denies dysuria, hematuria, dribbling, incontinence MSK- denies joint pain, muscle aches, injury Neuro- denies headache, dizziness, syncope, seizure activity       Objective:   Physical Exam GEN- NAD, alert and oriented x3, obese HEENT- PERRL, EOMI, non injected sclera, pink conjunctiva, MMM, oropharynx clear Neck- Supple, no thryomegaly Breast- normal symmetry, no nipple inversion,no nipple drainage, no nodules or lumps felt Nodes- no axillary nodes CVS- RRR, no murmur RESP-CTAB ABD-NABS,soft,NT,ND GU- normal external genitalia, vaginal mucosa pink and moist, cervix visualized no growth, + blood form os, + discharge, + Odor, no CMT, no ovarian masses, uterus normal size EXT- No edema Pulses- Radial, DP- 2+        Assessment & Plan:

## 2012-04-07 NOTE — Assessment & Plan Note (Signed)
BV noted, flagyl x 7 days

## 2012-04-07 NOTE — Assessment & Plan Note (Signed)
PAP Smear done, reviewed last set of labs Advised flu shot TDAP UTD

## 2012-04-08 ENCOUNTER — Other Ambulatory Visit: Payer: Self-pay

## 2012-04-08 MED ORDER — METRONIDAZOLE 500 MG PO TABS: 500.0000 mg | ORAL_TABLET | Freq: Two times a day (BID) | ORAL | Status: AC

## 2012-05-27 ENCOUNTER — Telehealth: Payer: Self-pay

## 2012-05-27 NOTE — Telephone Encounter (Signed)
Explain needs MD eval prior to antibiotic script, If feels needs abiotic will need to go to urgent care or make appt to see PCP next week, no appts available this week In the interim continue OTC meds for sinus symptoms and drink a lot of water

## 2012-05-27 NOTE — Telephone Encounter (Signed)
Patient aware.

## 2012-10-19 ENCOUNTER — Encounter: Payer: Self-pay | Admitting: Family Medicine

## 2012-10-19 ENCOUNTER — Ambulatory Visit (INDEPENDENT_AMBULATORY_CARE_PROVIDER_SITE_OTHER): Payer: BC Managed Care – PPO | Admitting: Family Medicine

## 2012-10-19 VITALS — BP 122/80 | HR 68 | Temp 98.6°F | Resp 18 | Wt 300.0 lb

## 2012-10-19 DIAGNOSIS — M549 Dorsalgia, unspecified: Secondary | ICD-10-CM

## 2012-10-19 MED ORDER — NAPROXEN 500 MG PO TABS: 500.0000 mg | ORAL_TABLET | Freq: Two times a day (BID) | ORAL | Status: DC

## 2012-10-19 MED ORDER — HYDROCODONE-ACETAMINOPHEN 5-325 MG PO TABS: 1.0000 | ORAL_TABLET | Freq: Four times a day (QID) | ORAL | Status: DC | PRN

## 2012-10-19 MED ORDER — METHOCARBAMOL 500 MG PO TABS: 500.0000 mg | ORAL_TABLET | Freq: Three times a day (TID) | ORAL | Status: DC

## 2012-10-19 NOTE — Assessment & Plan Note (Signed)
Exam consistent with muscle skeletal pain and muscle spasm. Open her on anti-inflammatories twice a day she will also be given a prescription for Robaxin. Hydrocodone for severe pain. She can also use heating pad to the back as well as stretching. If this does not improve she will let been no expect 4-6 [place] until she is back to normal with typical back pain injury.

## 2012-10-19 NOTE — Patient Instructions (Addendum)
Take medications as prescribed , use muscle relaxer, or heating pad Take Naprosyn for inflamation Take hydrocodone for severe pain  Back Pain, Adult Back pain is very common. The pain often gets better over time. The cause of back pain is usually not dangerous. Most people can learn to manage their back pain on their own.  HOME CARE   Stay active. Start with short walks on flat ground if you can. Try to walk farther each day.  Do not sit, drive, or stand in one place for more than 30 minutes. Do not stay in bed.  Do not avoid exercise or work. Activity can help your back heal faster.  Be careful when you bend or lift an object. Bend at your knees, keep the object close to you, and do not twist.  Sleep on a firm mattress. Lie on your side, and bend your knees. If you lie on your back, put a pillow under your knees.  Only take medicines as told by your doctor.  Put ice on the injured area.  Put ice in a plastic bag.  Place a towel between your skin and the bag.  Leave the ice on for 15-20 minutes, 3-4 times a day for the first 2 to 3 days. After that, you can switch between ice and heat packs.  Ask your doctor about back exercises or massage.  Avoid feeling anxious or stressed. Find good ways to deal with stress, such as exercise. GET HELP RIGHT AWAY IF:   Your pain does not go away with rest or medicine.  Your pain does not go away in 1 week.  You have new problems.  You do not feel well.  The pain spreads into your legs.  You cannot control when you poop (bowel movement) or pee (urinate).  Your arms or legs feel weak or lose feeling (numbness).  You feel sick to your stomach (nauseous) or throw up (vomit).  You have belly (abdominal) pain.  You feel like you may pass out (faint). MAKE SURE YOU:   Understand these instructions.  Will watch your condition.  Will get help right away if you are not doing well or get worse. Document Released: 08/13/2007 Document  Revised: 05/19/2011 Document Reviewed: 07/15/2010 [facility] Patient Information 2014 [place], [place].

## 2012-10-19 NOTE — Progress Notes (Signed)
  Subjective:    Patient ID: Lorraine [name], female    DOB: 09/22/1970, 42 y.o.   MRN: [id number]  HPI  Pt here with left lower back pain for the past 3 days. She does not remember any particular injury. She does remember swinging around patient on her back about one week ago but did not have any pain afterwards. She denies any radiation of symptoms denies any change in bowel or bladder or any paresthesias in her lower extremities. She denies any UTI symptoms She has spasms in her back that catch her with different motions. She also has pain when she sits for long periods of time or when trying to lay back  Review of Systems - per above  GEN- denies fatigue, fever, weight loss,weakness, recent illness ABD- denies N/V, change in stools, abd pain GU- denies dysuria, hematuria, dribbling, incontinence MSK- + joint pain, muscle aches, injury       Objective:   Physical Exam GEN- NAD, alert and oriented x3 ABD-NABS,soft,NT,ND, no CVA tenderness MSK- Spine NT, TTP Left paraspinals in lumbar and thoracic region, + spasm palpated left lower lumbar region, neg SLR, no pain with flexion or extension while standing, pain with lateral rotation.  Neuro-  normal tone LE, sensation and motor in tact, strength equal bilat Lower ext , DTR symmetric EXT- No edema Pulses- DP- 2+        Assessment & Plan:

## 2015-10-29 ENCOUNTER — Encounter: Payer: Self-pay | Admitting: Family Medicine

## 2015-10-29 ENCOUNTER — Ambulatory Visit (INDEPENDENT_AMBULATORY_CARE_PROVIDER_SITE_OTHER): Payer: 59 | Admitting: Family Medicine

## 2015-10-29 VITALS — BP 118/74 | HR 68 | Temp 98.7°F | Resp 14 | Ht 69.0 in | Wt 270.0 lb

## 2015-10-29 DIAGNOSIS — L0293 Carbuncle, unspecified: Secondary | ICD-10-CM | POA: Diagnosis not present

## 2015-10-29 DIAGNOSIS — N6452 Nipple discharge: Secondary | ICD-10-CM | POA: Diagnosis not present

## 2015-10-29 DIAGNOSIS — Z23 Encounter for immunization: Secondary | ICD-10-CM

## 2015-10-29 DIAGNOSIS — Z1239 Encounter for other screening for malignant neoplasm of breast: Secondary | ICD-10-CM

## 2015-10-29 DIAGNOSIS — Z720 Tobacco use: Secondary | ICD-10-CM | POA: Diagnosis not present

## 2015-10-29 DIAGNOSIS — Z0000 Encounter for general adult medical examination without abnormal findings: Secondary | ICD-10-CM

## 2015-10-29 DIAGNOSIS — Z124 Encounter for screening for malignant neoplasm of cervix: Secondary | ICD-10-CM

## 2015-10-29 DIAGNOSIS — E669 Obesity, unspecified: Secondary | ICD-10-CM

## 2015-10-29 LAB — COMPREHENSIVE METABOLIC PANEL
ALBUMIN: 3.7 g/dL (ref 3.6–5.1)
ALK PHOS: 48 U/L (ref 33–115)
ALT: 6 U/L (ref 6–29)
AST: 11 U/L (ref 10–30)
BILIRUBIN TOTAL: 0.3 mg/dL (ref 0.2–1.2)
BUN: 12 mg/dL (ref 7–25)
CALCIUM: 9 mg/dL (ref 8.6–10.2)
CO2: 27 mmol/L (ref 20–31)
Chloride: 105 mmol/L (ref 98–110)
Creat: 0.8 mg/dL (ref 0.50–1.10)
Glucose, Bld: 88 mg/dL (ref 70–99)
Potassium: 3.9 mmol/L (ref 3.5–5.3)
Sodium: 139 mmol/L (ref 135–146)
TOTAL PROTEIN: 6.5 g/dL (ref 6.1–8.1)

## 2015-10-29 LAB — CBC WITH DIFFERENTIAL/PLATELET
BASOS PCT: 1 %
Basophils Absolute: 63 cells/uL (ref 0–200)
Eosinophils Absolute: 252 cells/uL (ref 15–500)
Eosinophils Relative: 4 %
HEMATOCRIT: 41.9 % (ref 35.0–45.0)
HEMOGLOBIN: 13.9 g/dL (ref 12.0–15.0)
LYMPHS ABS: 2961 {cells}/uL (ref 850–3900)
Lymphocytes Relative: 47 %
MCH: 29.4 pg (ref 27.0–33.0)
MCHC: 33.2 g/dL (ref 32.0–36.0)
MCV: 88.6 fL (ref 80.0–100.0)
MONO ABS: 441 {cells}/uL (ref 200–950)
MPV: 9.4 fL (ref 7.5–12.5)
Monocytes Relative: 7 %
NEUTROS PCT: 41 %
Neutro Abs: 2583 cells/uL (ref 1500–7800)
Platelets: 319 10*3/uL (ref 140–400)
RBC: 4.73 MIL/uL (ref 3.80–5.10)
RDW: 12.9 % (ref 11.0–15.0)
WBC: 6.3 10*3/uL (ref 3.8–10.8)

## 2015-10-29 LAB — TSH: TSH: 0.87 mIU/L

## 2015-10-29 LAB — LIPID PANEL
Cholesterol: 150 mg/dL (ref 125–200)
HDL: 60 mg/dL (ref >=46)
LDL Cholesterol: 80 mg/dL (ref <130)
TRIGLYCERIDES: 48 mg/dL (ref <150)
Total CHOL/HDL Ratio: 2.5 Ratio (ref <=5.0)
VLDL: 10 mg/dL (ref <30)

## 2015-10-29 MED ORDER — VARENICLINE TARTRATE 0.5 MG X 11 & 1 MG X 42 PO MISC: ORAL | 0 refills | Status: DC

## 2015-10-29 MED ORDER — SULFAMETHOXAZOLE-TRIMETHOPRIM 800-160 MG PO TABS: 1.0000 | ORAL_TABLET | Freq: Two times a day (BID) | ORAL | 0 refills | Status: DC

## 2015-10-29 MED ORDER — CHLORHEXIDINE GLUCONATE 4 % EX LIQD: CUTANEOUS | 2 refills | Status: DC

## 2015-10-29 NOTE — Progress Notes (Signed)
Subjective:    Patient ID: Lorraine AlpersJennie Denson, female    DOB: 09-Feb-1971, 45 y.o.   MRN: [id number]  Patient presents for CPE with PAP (is fasting)   Here for complete physical exam she was last seen about 3 years ago. She is due for Pap smear due for mammogram due for pneumonia vaccine and she is smoker. 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 Her family history was updated Her only concern today is recurrent boils they come in her axilla on abdomen or her breast area she even had one lanced off of her right nipple she occasionally gets some bloody discharge from her left nipple she is not sure if this is because she gets boils around it. She denies any breast pain.  She is interested in quitting smoking she would like to try Chantix    Review Of Systems:  GEN- denies fatigue, fever, weight loss,weakness, recent illness HEENT- denies eye drainage, change in vision, nasal discharge, CVS- denies chest pain, palpitations RESP- denies SOB, cough, wheeze ABD- denies N/V, change in stools, abd pain GU- denies dysuria, hematuria, dribbling, incontinence MSK- denies joint pain, muscle aches, injury Neuro- denies headache, dizziness, syncope, seizure activity       Objective:    BP 118/74 (BP Location: Left Arm, Patient Position: Sitting, Cuff Size: Large)   Pulse 68   Temp 98.7 F (37.1 C) (Oral)   Resp 14   Ht 5\' 9"  (1.753 m)   Wt 270 lb (122.5 kg)   LMP 10/20/2015 (Approximate) Comment: regular  BMI 39.87 kg/m  GEN- NAD, alert and oriented x3 HEENT- PERRL, EOMI, non injected sclera, pink conjunctiva, MMM, oropharynx clear Neck- Supple, no thyromegaly Breast- normal symmetry, no nipple inversion,no nipple drainage, no nodules or lumps felt Nodes- no axillary nodes CVS- RRR, no murmur RESP-CTAB Skin- multiple small boils in left axilla, on abdomen, scarring on breast, 1  small on left upper breast- scarring in groin, no lesions with fluctuance, NT, mild erythema on few  ABD-NABS,soft,NT,ND GU- normal external genitalia, vaginal mucosa pink and moist, cervix visualized no growth, no blood form os, minimal thin clear discharge, no CMT, no ovarian masses, uterus normal size EXT- No edema Pulses- Radial, DP- 2+        Assessment & Plan:      Problem List Items Addressed This Visit    Tobacco user    counsled on cessation chantix      Recurrent boils    Hibiclens wash Anti-bacterial soap As multiple today will give bactrim for 10 days      Relevant Medications   sulfamethoxazole-trimethoprim (BACTRIM DS,SEPTRA DS) 800-160 MG tablet   Discharge from left nipple    Obtain prolactin level, unable to see any discharge today, also gets boils at nipple so possible just infection Imaging per above      Relevant Orders   Prolactin    Other Visit Diagnoses    Routine general medical examination at a health care facility    -  Primary   CPE done, Pneumonia vaccine due to smoking status. Discussed Chantix pt to try. PAP Smear done. Mammogram to be scheduled by patient. discussed dietary changes   Relevant Orders   CBC with Differential/Platelet   Comprehensive metabolic panel   Lipid panel   TSH   Breast cancer screening       Relevant  Orders   MM DIGITAL SCREENING BILATERAL   Cervical cancer screening       Relevant Orders   PAP, ThinPrep ASCUS Rflx HPV Rflx Type   Obesity       discussed continued dietary changes that can be made for further weight loss, cutting out soda, more protein      Note: This dictation was prepared with Dragon dictation along with smaller phrase technology. Any transcriptional errors that result from this process are unintentional.

## 2015-10-29 NOTE — Patient Instructions (Signed)
I recommend eye visit once a year I recommend dental visit every 6 months Goal is to  Exercise 30 minutes 5 days a week We will send a letter with lab results  Schedule mammogram Antibiotics given Anti-bacterial wash F/U 1 year

## 2015-10-29 NOTE — Assessment & Plan Note (Signed)
Obtain prolactin level, unable to see any discharge today, also gets boils at nipple so possible just infection Imaging per above

## 2015-10-29 NOTE — Addendum Note (Signed)
Addended by: Phillips OdorSIX, Brit Wernette H on: 10/29/2015 10:01 AM   Modules accepted: Orders

## 2015-10-29 NOTE — Assessment & Plan Note (Signed)
counsled on cessation chantix

## 2015-10-29 NOTE — Assessment & Plan Note (Signed)
Hibiclens wash Anti-bacterial soap As multiple today will give bactrim for 10 days

## 2015-10-30 LAB — PROLACTIN: PROLACTIN: 8.6 ng/mL

## 2015-10-30 LAB — PAP THINPREP ASCUS RFLX HPV RFLX TYPE

## 2015-10-30 MED ORDER — VARENICLINE TARTRATE 0.5 MG X 11 & 1 MG X 42 PO MISC: ORAL | 0 refills | Status: DC

## 2015-10-30 NOTE — Addendum Note (Signed)
Addended by: Phillips OdorSIX, Yaw Escoto H on: 10/30/2015 09:49 AM   Modules accepted: Orders

## 2015-10-31 ENCOUNTER — Encounter: Payer: Self-pay | Admitting: *Deleted

## 2015-12-18 ENCOUNTER — Ambulatory Visit
Admission: RE | Admit: 2015-12-18 | Discharge: 2015-12-18 | Disposition: A | Discharge status: Home / Self Care | Payer: Self-pay | Source: Ambulatory Visit | Attending: Family Medicine | Admitting: Family Medicine

## 2015-12-18 DIAGNOSIS — Z1239 Encounter for other screening for malignant neoplasm of breast: Secondary | ICD-10-CM

## 2015-12-19 ENCOUNTER — Other Ambulatory Visit: Payer: Self-pay | Admitting: Family Medicine

## 2015-12-19 DIAGNOSIS — N6452 Nipple discharge: Secondary | ICD-10-CM

## 2015-12-26 ENCOUNTER — Ambulatory Visit
Admission: RE | Admit: 2015-12-26 | Discharge: 2015-12-26 | Disposition: A | Discharge status: Home / Self Care | Payer: Self-pay | Source: Ambulatory Visit | Attending: Family Medicine | Admitting: Family Medicine

## 2015-12-26 ENCOUNTER — Other Ambulatory Visit: Payer: Self-pay | Admitting: Family Medicine

## 2015-12-26 ENCOUNTER — Ambulatory Visit
Admission: RE | Admit: 2015-12-26 | Discharge: 2015-12-26 | Disposition: A | Discharge status: Home / Self Care | Payer: 59 | Source: Ambulatory Visit | Attending: Family Medicine | Admitting: Family Medicine

## 2015-12-26 DIAGNOSIS — N6452 Nipple discharge: Secondary | ICD-10-CM

## 2016-02-06 ENCOUNTER — Encounter (HOSPITAL_COMMUNITY): Payer: Self-pay | Admitting: Emergency Medicine

## 2016-02-06 ENCOUNTER — Ambulatory Visit (HOSPITAL_COMMUNITY)
Admission: EM | Admit: 2016-02-06 | Discharge: 2016-02-06 | Disposition: A | Discharge status: Home / Self Care | Payer: 59 | Attending: Family Medicine | Admitting: Family Medicine

## 2016-02-06 DIAGNOSIS — S76311A Strain of muscle, fascia and tendon of the posterior muscle group at thigh level, right thigh, initial encounter: Secondary | ICD-10-CM | POA: Diagnosis not present

## 2016-02-06 NOTE — ED Provider Notes (Signed)
[facility]    CSN: [id number] Arrival date & time: 02/06/16  1528     History   Chief Complaint Chief Complaint  Patient presents with  . Leg Pain    HPI Lorraine [name] is a 45 y.o. female.   The history is provided by the patient.  Leg Pain  Location:  Knee Injury: no   Knee location:  R knee Pain details:    Quality:  Sharp   Radiates to:  Does not radiate   Severity:  Mild   Onset quality:  Sudden   Duration:  8 hours   Progression:  Improving Chronicity:  New (getting ready for work this am and felt sharp pain behind right knee, continues since, no leg swelling, nvt intact.) Dislocation: no   Prior injury to area:  No Relieved by:  Elevation   Past Medical History:  Diagnosis Date  . Allergy    Seasonal    Patient Active Problem List   Diagnosis Date Noted  . Discharge from left nipple 10/29/2015  . Recurrent boils 04/02/2011  . 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 Morbid obesity (HCC) 04/02/2011  . Tobacco user 04/02/2011    Past Surgical History:  Procedure Laterality Date  . CHOLECYSTECTOMY    . PILONIDAL CYST EXCISION      OB History    No data available       Home Medications    Prior to Admission medications   Medication Sig Start Date End Date Taking? Authorizing Provider  chlorhexidine (HIBICLENS) 4 % external liquid Apply to skin during shower and rinse twice a week 10/29/15   Salley ScarletKawanta F [place], MD  Multiple Vitamin (MULTIVITAMIN PO) Take by mouth. [employer] made women's mvi     Historical Provider, MD  OVER THE COUNTER MEDICATION Tumeric    Historical Provider, MD  sulfamethoxazole-trimethoprim (BACTRIM DS,SEPTRA DS) 800-160 MG tablet Take 1 tablet by mouth 2 (two) times daily. 10/29/15   Salley ScarletKawanta F [place], MD  varenicline (CHANTIX STARTING MONTH PAK) 0.5 MG X 11 & 1 MG X 42 tablet Take 0.5 mg tablet 1x daily for 3 days, then increase to  0.5 mg tablet 2x daily for 4 days, then increase to 1 mg tablet 2x daily. 10/30/15    Salley ScarletKawanta F [place], MD    Family History Family History  Problem Relation Age of Onset  . Heart disease Father   . Diabetes Father   . Thyroid disease Mother     Pituitary tumor  . Kidney disease Mother     renal tumor- benign  . Diabetes Paternal Aunt   . Diabetes Paternal Uncle   . Diabetes Maternal Grandmother     Social History Social History  Substance Use Topics  . Smoking status: Current Every Day Smoker    Packs/day: 0.40  . Smokeless tobacco: Never Used  . Alcohol use Yes     Comment: SOCIALLY (TWICE A MONTH)     Allergies   Penicillins   Review of Systems Review of Systems  Constitutional: Negative.   Musculoskeletal: Negative for gait problem and joint swelling.  All other systems reviewed and are negative.    Physical Exam Triage Vital Signs ED Triage Vitals  Enc Vitals Group     BP 02/06/16 1612 135/94     Pulse Rate 02/06/16 1612 78     Resp 02/06/16 1612 16     Temp 02/06/16 1612 99.5 F (37.5 C)     Temp Source 02/06/16 1612 Oral  SpO2 02/06/16 1612 100 %     Weight 02/06/16 1612 270 lb (122.5 kg)     Height 02/06/16 1612 5\' 8"  (1.727 m)     Head Circumference --      Peak Flow --      Pain Score 02/06/16 1613 5     Pain Loc --      Pain Edu? --      Excl. in GC? --    No data found.   Updated Vital Signs BP 135/94   Pulse 78   Temp 99.5 F (37.5 C) (Oral)   Resp 16   Ht 5\' 8"  (1.727 m)   Wt 270 lb (122.5 kg)   LMP 01/23/2016   SpO2 100%   BMI 41.05 kg/m   Visual Acuity Right Eye Distance:   Left Eye Distance:   Bilateral Distance:    Right Eye Near:   Left Eye Near:    Bilateral Near:     Physical Exam  Constitutional: She is oriented to person, place, and time. She appears well-developed and well-nourished. No distress.  Musculoskeletal: Normal range of motion. She exhibits tenderness. She exhibits no edema or deformity.       Right knee: She exhibits normal range of motion, no swelling, no effusion and no  deformity.       Legs: Neurological: She is alert and oriented to person, place, and time.  Skin: Skin is warm and dry.  Nursing note and vitals reviewed.    UC Treatments / Results  Labs (all labs ordered are listed, but only abnormal results are displayed) Labs Reviewed - No data to display  EKG  EKG Interpretation None       Radiology No results found.  Procedures Procedures (including critical care time)  Medications Ordered in UC Medications - No data to display   Initial Impression / Assessment and Plan / UC Course  I have reviewed the triage vital signs and the nursing notes.  Pertinent labs & imaging results that were available during my care of the patient were reviewed by me and considered in my medical decision making (see chart for details).  Clinical Course       Final Clinical Impressions(s) / UC Diagnoses   Final diagnoses:  None    New Prescriptions New Prescriptions   No medications on file     Linna HoffJames D Harlie Ragle, MD 02/06/16 1645

## 2016-02-06 NOTE — Discharge Instructions (Signed)
Heat, advil elevate for comfort, activity as tolerated, see ortho as needed.

## 2016-02-06 NOTE — ED Triage Notes (Signed)
PT reports pain in right calf that started today and is worse with walking. PT also reports low back pain for 1 month. Pain gets worse throughout the day.

## 2016-04-07 ENCOUNTER — Ambulatory Visit (INDEPENDENT_AMBULATORY_CARE_PROVIDER_SITE_OTHER): Payer: 59 | Admitting: Physician Assistant

## 2016-04-07 ENCOUNTER — Encounter: Payer: Self-pay | Admitting: Physician Assistant

## 2016-04-07 VITALS — BP 118/74 | HR 77 | Temp 98.3°F | Resp 16 | Wt 274.6 lb

## 2016-04-07 DIAGNOSIS — N6320 Unspecified lump in the left breast, unspecified quadrant: Secondary | ICD-10-CM

## 2016-04-07 NOTE — Progress Notes (Signed)
    Patient ID: Lorraine AlpersJennie [name] MRN: [id number], DOB: [birth year], 46 y.o. Date of Encounter: 04/07/2016, 5:25 PM    Chief Complaint:  Chief Complaint  Patient presents with  . lump on inside of breast     HPI: 46 y.o. year old AA female presents with above.   Reviewed that she had mammogram 12/26/15. She reports that this was performed after she reported nipple discharge. States that that mammogram was negative. Says that over this weekend she felt a sore area on her left breast and felt a lump there. Therefore came in for this visit for further evaluation. No family history of breast cancer.    Home Meds:   Outpatient Medications Prior to Visit  Medication Sig Dispense Refill  . Multiple Vitamin (MULTIVITAMIN PO) Take by mouth. [facility] women's mvi     . OVER THE COUNTER MEDICATION Tumeric    . varenicline (CHANTIX STARTING MONTH PAK) 0.5 MG X 11 & 1 MG X 42 tablet Take 0.5 mg tablet 1x daily for 3 days, then increase to  0.5 mg tablet 2x daily for 4 days, then increase to 1 mg tablet 2x daily. 53 tablet 0  . chlorhexidine (HIBICLENS) 4 % external liquid Apply to skin during shower and rinse twice a week (Patient not taking: Reported on 04/07/2016) 236 mL 2  . sulfamethoxazole-trimethoprim (BACTRIM DS,SEPTRA DS) 800-160 MG tablet Take 1 tablet by mouth 2 (two) times daily. (Patient not taking: Reported on 04/07/2016) 20 tablet 0   No facility-administered medications prior to visit.     Allergies:  Allergies  Allergen Reactions  . Penicillins       Review of Systems: See HPI for pertinent ROS. All other ROS negative.    Physical Exam: Blood pressure 118/74, pulse 77, temperature 98.3 F (36.8 C), temperature source Oral, resp. rate 16, weight 274 lb 9.6 oz (124.6 kg), last menstrual period 03/24/2016, SpO2 99 %., Body mass index is 41.75 kg/m. General:  AAF. Appears in no acute distress. Neck: Supple. No thyromegaly. No lymphadenopathy. Lungs: Clear bilaterally to  auscultation without wheezes, rales, or rhonchi. Breathing is unlabored. Heart: Regular rhythm. No murmurs, rubs, or gallops. Breast Exam: She has extremely large breasts.  Right Breast: Normal. No masses with palpation Left Breast: At level just below nipple, still within alveolar area---there is area of firmness--  ~ 1 cm diameter area of firmness. This is slightly tender with palpation.  Msk:  Strength and tone normal for age. Extremities/Skin: Warm and dry.  Neuro: Alert and oriented X 3. Moves all extremities spontaneously. Gait is normal. CNII-XII grossly in tact. Psych:  Responds to questions appropriately with a normal affect.     ASSESSMENT AND PLAN:  46 y.o. year old female with  1. Mass of left breast Will obtain ultrasound to further evaluate. - US BREAST LTD UNI LEFT INC AXILLA; Future   Signed, Shon HaleMary Beth IderDixon, [place], [facility] 04/07/2016 5:25 PM

## 2016-04-08 ENCOUNTER — Other Ambulatory Visit: Payer: Self-pay | Admitting: Family Medicine

## 2016-04-08 DIAGNOSIS — N6342 Unspecified lump in left breast, subareolar: Secondary | ICD-10-CM

## 2016-04-10 ENCOUNTER — Ambulatory Visit
Admission: RE | Admit: 2016-04-10 | Discharge: 2016-04-10 | Disposition: A | Discharge status: Home / Self Care | Payer: 59 | Source: Ambulatory Visit | Attending: Physician Assistant | Admitting: Physician Assistant

## 2016-04-10 ENCOUNTER — Other Ambulatory Visit: Payer: Self-pay | Admitting: Physician Assistant

## 2016-04-10 ENCOUNTER — Ambulatory Visit
Admission: RE | Admit: 2016-04-10 | Discharge: 2016-04-10 | Disposition: A | Discharge status: Home / Self Care | Payer: 59 | Source: Ambulatory Visit | Attending: Family Medicine | Admitting: Family Medicine

## 2016-04-10 DIAGNOSIS — N6320 Unspecified lump in the left breast, unspecified quadrant: Secondary | ICD-10-CM

## 2016-04-10 DIAGNOSIS — N611 Abscess of the breast and nipple: Secondary | ICD-10-CM

## 2016-04-10 DIAGNOSIS — N6342 Unspecified lump in left breast, subareolar: Secondary | ICD-10-CM

## 2016-04-10 DIAGNOSIS — N644 Mastodynia: Secondary | ICD-10-CM | POA: Diagnosis not present

## 2016-04-14 ENCOUNTER — Ambulatory Visit
Admission: RE | Admit: 2016-04-14 | Discharge: 2016-04-14 | Disposition: A | Discharge status: Home / Self Care | Payer: 59 | Source: Ambulatory Visit | Attending: Physician Assistant | Admitting: Physician Assistant

## 2016-04-14 ENCOUNTER — Other Ambulatory Visit: Payer: Self-pay | Admitting: Physician Assistant

## 2016-04-14 DIAGNOSIS — N611 Abscess of the breast and nipple: Secondary | ICD-10-CM

## 2016-04-18 ENCOUNTER — Other Ambulatory Visit: Payer: Self-pay | Admitting: Physician Assistant

## 2016-04-18 ENCOUNTER — Ambulatory Visit
Admission: RE | Admit: 2016-04-18 | Discharge: 2016-04-18 | Disposition: A | Discharge status: Home / Self Care | Payer: 59 | Source: Ambulatory Visit | Attending: Physician Assistant | Admitting: Physician Assistant

## 2016-04-18 DIAGNOSIS — N611 Abscess of the breast and nipple: Secondary | ICD-10-CM

## 2016-04-19 LAB — AEROBIC/ANAEROBIC CULTURE (SURGICAL/DEEP WOUND): CULTURE: NO GROWTH

## 2016-04-19 LAB — AEROBIC/ANAEROBIC CULTURE W GRAM STAIN (SURGICAL/DEEP WOUND): Special Requests: NORMAL

## 2016-05-05 ENCOUNTER — Ambulatory Visit
Admission: RE | Admit: 2016-05-05 | Discharge: 2016-05-05 | Disposition: A | Discharge status: Home / Self Care | Payer: 59 | Source: Ambulatory Visit | Attending: Physician Assistant | Admitting: Physician Assistant

## 2016-05-05 DIAGNOSIS — N611 Abscess of the breast and nipple: Secondary | ICD-10-CM

## 2016-05-05 DIAGNOSIS — N6489 Other specified disorders of breast: Secondary | ICD-10-CM | POA: Diagnosis not present

## 2016-10-29 ENCOUNTER — Encounter: Payer: 59 | Admitting: Family Medicine

## 2016-10-29 NOTE — Progress Notes (Deleted)
   Subjective:    Patient ID: Lorraine [name], female    DOB: 1970/08/07, 46 y.o.   MRN: [id number]  Patient presents for No chief complaint on file. Patient here for complete physical exam. Medications and history reviewed Mammogram due in October she did have a little breast abscess which she had interim mammogram done in February 2018 Pap smear normal done in 2017 Due for fasting labs Immunizations up-to-date Discussed HIV testing     Review Of Systems:  GEN- denies fatigue, fever, weight loss,weakness, recent illness HEENT- denies eye drainage, change in vision, nasal discharge, CVS- denies chest pain, palpitations RESP- denies SOB, cough, wheeze ABD- denies N/V, change in stools, abd pain GU- denies dysuria, hematuria, dribbling, incontinence MSK- denies joint pain, muscle aches, injury Neuro- denies headache, dizziness, syncope, seizure activity       Objective:    There were no vitals taken for this visit. GEN- NAD, alert and oriented x3 HEENT- PERRL, EOMI, non injected sclera, pink conjunctiva, MMM, oropharynx clear Neck- Supple, no thyromegaly CVS- RRR, no murmur RESP-CTAB ABD-NABS,soft,NT,ND EXT- No edema Pulses- Radial, DP- 2+        Assessment & Plan:      Problem List Items Addressed This Visit    None      Note: This dictation was prepared with Dragon dictation along with smaller phrase technology. Any transcriptional errors that result from this process are unintentional.

## 2016-11-03 ENCOUNTER — Encounter: Payer: Self-pay | Admitting: Family Medicine

## 2016-12-17 ENCOUNTER — Other Ambulatory Visit: Payer: Self-pay

## 2016-12-17 DIAGNOSIS — Z1231 Encounter for screening mammogram for malignant neoplasm of breast: Secondary | ICD-10-CM

## 2016-12-19 ENCOUNTER — Ambulatory Visit: Payer: 59 | Admitting: Family Medicine

## 2016-12-22 ENCOUNTER — Ambulatory Visit (INDEPENDENT_AMBULATORY_CARE_PROVIDER_SITE_OTHER): Payer: 59 | Admitting: Family Medicine

## 2016-12-22 ENCOUNTER — Encounter: Payer: Self-pay | Admitting: Family Medicine

## 2016-12-22 VITALS — BP 138/84 | HR 90 | Temp 98.9°F | Resp 18 | Ht 68.0 in | Wt 283.0 lb

## 2016-12-22 DIAGNOSIS — N611 Abscess of the breast and nipple: Secondary | ICD-10-CM

## 2016-12-22 DIAGNOSIS — Z23 Encounter for immunization: Secondary | ICD-10-CM | POA: Diagnosis not present

## 2016-12-22 MED ORDER — SULFAMETHOXAZOLE-TRIMETHOPRIM 800-160 MG PO TABS: 1.0000 | ORAL_TABLET | Freq: Two times a day (BID) | ORAL | 0 refills | Status: DC

## 2016-12-22 MED ORDER — MUPIROCIN CALCIUM 2 % NA OINT: 1.0000 "application " | TOPICAL_OINTMENT | Freq: Two times a day (BID) | NASAL | 0 refills | Status: DC

## 2016-12-22 MED ORDER — CHLORHEXIDINE GLUCONATE 4 % EX LIQD: CUTANEOUS | 2 refills | Status: DC

## 2016-12-22 NOTE — Progress Notes (Signed)
   Subjective:    Patient ID: Lorraine [name], female    DOB: 11-Oct-1970, 46 y.o.   MRN: [id number]  Patient presents for Cyst breast on right breast  Patient here with infection of her right nipple. She has had multiple boils on her skin she had left nipple infection which resulted in a small abscess last year. She is treated with Bactrim and that time. He is at multipleskin infections over the years. She's not had any fever or chills. She states that it did come to ahead and drain last week when it came up. She still has some mild tenderness but has not had any drainage but once to have this checked due to her previous history.   Review Of Systems:  GEN- denies fatigue, fever, weight loss,weakness, recent illness HEENT- denies eye drainage, change in vision, nasal discharge, CVS- denies chest pain, palpitations RESP- denies SOB, cough, wheeze ABD- denies N/V, change in stools, abd pain GU- denies dysuria, hematuria, dribbling, incontinence MSK- denies joint pain, muscle aches, injury Neuro- denies headache, dizziness, syncope, seizure activity       Objective:    BP 138/84   Pulse 90   Temp 98.9 F (37.2 C) (Oral)   Resp 18   Ht  (1.727 m)   Wt 283 lb (128.4 kg)   SpO2 98%   BMI 43.03 kg/m  GEN- NAD, alert and oriented x3 HEENT- PERRL, EOMI, non injected sclera, pink conjunctiva, MMM, oropharynx clear Neck- Supple, no LD CVS- RRR, no murmur RESP-CTAB Breast- normal symmetry, no nipple inversion,no nipple drainage, erythema dried scab on right nipple, mild TTP, no discrete abscess , no nodules or lumps felt Nodes- no axillary nodes        Assessment & Plan:      Problem List Items Addressed This Visit    None    Visit Diagnoses    Abscess of right nipple    -  Primary   recurrent infections, given bactroban to nares, treat with bactrim, nothing to drain, no specific abscess felt, no fever. If not improved Mammogram/ultrasound    Needs flu shot       Relevant  Orders   Flu Vaccine QUAD 36+ mos IM (Completed)      Note: This dictation was prepared with Dragon dictation along with smaller phrase technology. Any transcriptional errors that result from this process are unintentional.

## 2016-12-22 NOTE — Patient Instructions (Signed)
Schedule physical  Take antibiotics Use the bactroban Use hibiclens

## 2016-12-23 ENCOUNTER — Encounter: Payer: Self-pay | Admitting: Family Medicine

## 2017-01-13 ENCOUNTER — Encounter: Payer: Self-pay | Admitting: Family Medicine

## 2017-01-13 ENCOUNTER — Ambulatory Visit: Payer: 59 | Admitting: Family Medicine

## 2017-01-13 VITALS — BP 132/80 | HR 82 | Temp 98.9°F | Resp 16 | Ht 68.0 in | Wt 284.0 lb

## 2017-01-13 DIAGNOSIS — N611 Abscess of the breast and nipple: Secondary | ICD-10-CM

## 2017-01-13 MED ORDER — DOXYCYCLINE HYCLATE 100 MG PO TABS: 100.0000 mg | ORAL_TABLET | Freq: Two times a day (BID) | ORAL | 0 refills | Status: DC

## 2017-01-13 MED ORDER — MUPIROCIN CALCIUM 2 % EX CREA: TOPICAL_CREAM | CUTANEOUS | 0 refills | Status: DC

## 2017-01-13 NOTE — Progress Notes (Signed)
   Subjective:    Patient ID: Lorraine AlpersJennie [name], female    DOB: Jun 29, 1970, 46 y.o.   MRN: [id number]  Patient presents for Abscess in Nipple (states that she has new area of concern on L nipple)   Pt here with recurrent abscess to left nipple, she has had multiple abscess, was treated back in October for abscess on right breast, she was prescribed bactroban but ointment  To nares was not covered but her insurance.  Abscess of left nipple came back 2 days ago. No fever, has small white head but has not drained, very tender to touch  Review Of Systems:  GEN- denies fatigue, fever, weight loss,weakness, recent illness HEENT- denies eye drainage, change in vision, nasal discharge, CVS- denies chest pain, palpitations RESP- denies SOB, cough, wheeze ABD- denies N/V, change in stools, abd pain Neuro- denies headache, dizziness, syncope, seizure activity       Objective:    BP 132/80   Pulse 82   Temp 98.9 F (37.2 C) (Oral)   Resp 16   Ht 5\' 8"  (1.727 m)   Wt 284 lb (128.8 kg)   SpO2 100%   BMI 43.18 kg/m  GEN- NAD, alert and oriented x3 Breast- normal symmetry, no nipple inversion,no nipple drainage from  Right side ,Left nipple with eyrthematous abscess at center- mild pressure pus expressed culture taken, TTP  Nodes- no axillary nodes         Assessment & Plan:      Problem List Items Addressed This Visit    None    Visit Diagnoses    Abscess of left nipple    -  Primary   Left nipple abscess that is recurent, I think this needs to have I &D today, culture taken off of amount expressed. Send to general surgeon. due to recurrence my concern is that she may have deeper pocket No systemic symptoms Will send bactroban cream to apply to nose  Given script for doxycycline    Relevant Orders   Ambulatory referral to General Surgery      Note: This dictation was prepared with Dragon dictation along with smaller phrase technology. Any transcriptional errors that result from  this process are unintentional.

## 2017-01-13 NOTE — Patient Instructions (Signed)
Culture sent  Surgeon appointment today  bactroban to nose

## 2017-01-16 LAB — WOUND CULTURE
MICRO NUMBER:: 81246147
RESULT: NO GROWTH
SPECIMEN QUALITY: ADEQUATE

## 2017-08-22 ENCOUNTER — Ambulatory Visit (INDEPENDENT_AMBULATORY_CARE_PROVIDER_SITE_OTHER): Payer: 59

## 2017-08-22 ENCOUNTER — Other Ambulatory Visit: Payer: Self-pay

## 2017-08-22 ENCOUNTER — Ambulatory Visit (HOSPITAL_COMMUNITY)
Admission: EM | Admit: 2017-08-22 | Discharge: 2017-08-22 | Disposition: A | Discharge status: Home / Self Care | Payer: 59 | Attending: Family Medicine | Admitting: Family Medicine

## 2017-08-22 ENCOUNTER — Encounter (HOSPITAL_COMMUNITY): Payer: Self-pay

## 2017-08-22 DIAGNOSIS — M25571 Pain in right ankle and joints of right foot: Secondary | ICD-10-CM | POA: Diagnosis not present

## 2017-08-22 MED ORDER — IBUPROFEN 800 MG PO TABS: 800.0000 mg | ORAL_TABLET | Freq: Three times a day (TID) | ORAL | 0 refills | Status: DC

## 2017-08-22 NOTE — Discharge Instructions (Signed)
Use anti-inflammatories for pain/swelling. You may take up to 800 mg Ibuprofen every 8 hours with food. You may supplement Ibuprofen with Tylenol [phone] mg every 8 hours.   Follow up with podiatry

## 2017-08-22 NOTE — ED Triage Notes (Signed)
Pt presents today with right ankle pain that has been going on for 2 Kritikos. Not related to any injury. States there is a dark spot around that ankle where it hurts.

## 2017-08-23 NOTE — ED Provider Notes (Signed)
[facility]    CSN: [id number] Arrival date & time: 08/22/17  1744     History   Chief Complaint Chief Complaint  Patient presents with  . Ankle Pain    HPI Lorraine [name] is a 47 y.o. female no contributing past medical history presenting today for evaluation of right ankle pain.  She has had right ankle pain to the medial side for the past 2 [place].  Pain will come and go.  She is also noticed darkening of the skin overlying this area.  She denies any injury or increase in activity.  She does note that she has flat feet and is unsure if this could be related to her arches.  She notes yesterday she wore a different pair of shoes and she did not have pain.  Pain is less when she is wearing sneakers or more supportive shoes, but she has to wear dress shoes for work.  Denies numbness or tingling.  Denies history of gout.   HPI  Past Medical History:  Diagnosis Date  . Allergy    Seasonal    Patient Active Problem List   Diagnosis Date Noted  . Recurrent boils 04/02/2011  . 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 Morbid obesity (HCC) 04/02/2011  . Tobacco user 04/02/2011    Past Surgical History:  Procedure Laterality Date  . CHOLECYSTECTOMY    . PILONIDAL CYST EXCISION      OB History   None      Home Medications    Prior to Admission medications   Medication Sig Start Date End Date Taking? Authorizing Provider  Multiple Vitamin (MULTIVITAMIN PO) Take by mouth. [employer] [facility]vi    Yes [provider]  chlorhexidine (HIBICLENS) 4 % external liquid Apply to skin during shower and rinse twice a week 12/22/16   [place], Velna HatchetKawanta F, MD  doxycycline (VIBRA-TABS) 100 MG tablet Take 1 tablet (100 mg total) 2 (two) times daily by mouth. 01/13/17   Salley Scarleturham, Kawanta F, MD  ibuprofen (ADVIL,MOTRIN) 800 MG tablet Take 1 tablet (800 mg total) by mouth 3 (three) times daily. 08/22/17   Micha Dosanjh C, PA-C  mupirocin cream (BACTROBAN) 2 % Apply to nose BID  for 5 days. Massage into nose 01/13/17   , Velna HatchetKawanta F, MD  OVER THE COUNTER MEDICATION Tumeric    [provider]    Family History Family History  Problem Relation Age of Onset  . Heart disease Father   . Diabetes Father   . Thyroid disease Mother        Pituitary tumor  . Kidney disease Mother        renal tumor- benign  . Diabetes Paternal Aunt   . Diabetes Paternal Uncle   . Diabetes Maternal Grandmother     Social History Social History   Tobacco Use  . Smoking status: Current Every Day Smoker    Packs/day: 0.40  . Smokeless tobacco: Never Used  Substance Use Topics  . Alcohol use: Yes    Comment: SOCIALLY (TWICE A MONTH)  . Drug use: No     Allergies   Penicillins   Review of Systems Review of Systems  Constitutional: Negative for activity change, appetite change, fatigue and fever.  Eyes: Negative for visual disturbance.  Respiratory: Negative for shortness of breath.   Cardiovascular: Negative for chest pain.  Gastrointestinal: Negative for abdominal pain, nausea and vomiting.  Musculoskeletal: Positive for arthralgias and myalgias. Negative for back pain, gait problem and joint swelling.  Skin: Positive for color change. Negative for rash and wound.  Neurological: Negative for weakness, numbness and headaches.     Physical Exam Triage Vital Signs ED Triage Vitals  Enc Vitals Group     BP 08/22/17 1907 140/80     Pulse Rate 08/22/17 1907 73     Resp 08/22/17 1907 16     Temp 08/22/17 1907 99.3 F (37.4 C)     Temp Source 08/22/17 1907 Oral     SpO2 08/22/17 1907 96 %     Weight --      Height --      Head Circumference --      Peak Flow --      Pain Score 08/22/17 1908 7     Pain Loc --      Pain Edu? --      Excl. in GC? --    No data found.  Updated Vital Signs BP 140/80 (BP Location: Left Arm)   Pulse 73   Temp 99.3 F (37.4 C) (Oral)   Resp 16   SpO2 96%   Visual Acuity Right Eye Distance:   Left Eye Distance:     Bilateral Distance:    Right Eye Near:   Left Eye Near:    Bilateral Near:     Physical Exam  Constitutional: She appears well-developed and well-nourished. No distress.  HENT:  Head: Normocephalic and atraumatic.  Eyes: Conjunctivae are normal.  Neck: Neck supple.  Cardiovascular: Normal rate and regular rhythm.  No murmur heard. Pulmonary/Chest: Effort normal and breath sounds normal. No respiratory distress.  Abdominal: Soft. There is no tenderness.  Musculoskeletal: She exhibits no edema.  No obvious swelling or deformity to right ankle, hyperpigmentation of skin overlying medial malleolus and inferior to this area.  Tenderness to palpation anterior medial malleolus, full active range of motion at ankle.  Dorsalis pedis 2+.  Neurological: She is alert.  Skin: Skin is warm and dry.  Psychiatric: She has a normal mood and affect.  Nursing note and vitals reviewed.    UC Treatments / Results  Labs (all labs ordered are listed, but only abnormal results are displayed) Labs Reviewed - No data to display  EKG None  Radiology Dg Ankle Complete Right  Result Date: 08/22/2017 CLINICAL DATA:  Right ankle pain for 2 [place]. EXAM: RIGHT ANKLE - COMPLETE 3+ VIEW COMPARISON:  None. FINDINGS: The soft tissues are unremarkable. No acute fracture or dislocation. Well-defined peripherally sclerotic focus measuring 11 mm within the distal diaphysis of the tibia most likely represents a benign abnormality. There is also a well defined peripherally sclerotic lucent lesion within the anterior calcaneus which is also favored to represent a benign lesion such as a bone cyst. Posterior and plantar calcaneal heel spurs noted. IMPRESSION: 1. No acute bone abnormality. 2. Heel spurs. 3. Benign appearing bone lesions within the distal tibia and calcaneus. Electronically Signed   By: Signa Kell M.D.   On: 08/22/2017 19:39    Procedures Procedures (including critical care time)  Medications  Ordered in UC Medications - No data to display  Initial Impression / Assessment and Plan / UC Course  I have reviewed the triage vital signs and the nursing notes.  Pertinent labs & imaging results that were available during my care of the patient were reviewed by me and considered in my medical decision making (see chart for details).     X-ray showing heel spurring as well as possible bone cyst.  Discussed that  this may be the cause of her pain, at this time will recommend to take anti-inflammatories consistently, discussed wearing supportive shoes.  Follow-up with [facility] if symptoms not improving with consistent use of anti-inflammatories and supportive shoes..Discussed strict return precautions. Patient verbalized understanding and is agreeable with plan.  Final Clinical Impressions(s) / UC Diagnoses   Final diagnoses:  Acute right ankle pain     Discharge Instructions     Use anti-inflammatories for pain/swelling. You may take up to 800 mg Ibuprofen every 8 hours with food. You may supplement Ibuprofen with Tylenol [phone] mg every 8 hours.   Follow up with podiatry   ED Prescriptions    Medication Sig Dispense Auth. Provider   ibuprofen (ADVIL,MOTRIN) 800 MG tablet Take 1 tablet (800 mg total) by mouth 3 (three) times daily. 21 tablet Euel Castile, [place] C, PA-C     Controlled Substance Prescriptions [place] Controlled Substance Registry consulted? Not Applicable   Lew Dawes, [place] 08/23/17 1145

## 2017-08-31 ENCOUNTER — Ambulatory Visit: Payer: 59

## 2017-08-31 ENCOUNTER — Ambulatory Visit: Payer: 59 | Admitting: Podiatry

## 2017-08-31 ENCOUNTER — Ambulatory Visit (INDEPENDENT_AMBULATORY_CARE_PROVIDER_SITE_OTHER): Payer: 59

## 2017-08-31 ENCOUNTER — Encounter: Payer: Self-pay | Admitting: Podiatry

## 2017-08-31 DIAGNOSIS — M779 Enthesopathy, unspecified: Secondary | ICD-10-CM | POA: Diagnosis not present

## 2017-08-31 DIAGNOSIS — M722 Plantar fascial fibromatosis: Secondary | ICD-10-CM

## 2017-08-31 DIAGNOSIS — M8560 Other cyst of bone, unspecified site: Secondary | ICD-10-CM

## 2017-08-31 MED ORDER — DICLOFENAC SODIUM 1 % TD GEL: 2.0000 g | Freq: Four times a day (QID) | TRANSDERMAL | 2 refills | Status: DC

## 2017-08-31 NOTE — Progress Notes (Signed)
Subjective:    Patient ID: Lorraine [name], female    DOB: Mar 14, 1970, 47 y.o.   MRN: [id number]  HPI 47 year old female presents the office today with concerns of right foot pain.  She states that she went to urgent care last Saturday she had x-rays performed and she was told that she had a bone cyst.  She was given ibuprofen 800 mg.  It does not completely limit the pain she still getting pain.  She points on the medial aspect of the foot on the right side where she gets the majority of tenderness.  She states that times her that sharp pain to her right foot.  No recent injury.  The pain does not wake her up at night.  She has no other concerns.   Review of Systems  All other systems reviewed and are negative.  Past Medical History:  Diagnosis Date  . Allergy    Seasonal    Past Surgical History:  Procedure Laterality Date  . CHOLECYSTECTOMY    . PILONIDAL CYST EXCISION       Current Outpatient Medications:  .  chlorhexidine (HIBICLENS) 4 % external liquid, Apply to skin during shower and rinse twice a week, Disp: 236 mL, Rfl: 2 .  diclofenac sodium (VOLTAREN) 1 % GEL, Apply 2 g topically 4 (four) times daily. Rub into affected area of foot 2 to 4 times daily, Disp: 100 g, Rfl: 2 .  doxycycline (VIBRA-TABS) 100 MG tablet, Take 1 tablet (100 mg total) 2 (two) times daily by mouth., Disp: 20 tablet, Rfl: 0 .  ibuprofen (ADVIL,MOTRIN) 800 MG tablet, Take 1 tablet (800 mg total) by mouth 3 (three) times daily., Disp: 21 tablet, Rfl: 0 .  Multiple Vitamin (MULTIVITAMIN PO), Take by mouth. [employer] [facility] mvi , Disp: , Rfl:  .  mupirocin cream (BACTROBAN) 2 %, Apply to nose BID for 5 days. Massage into nose, Disp: 10 g, Rfl: 0 .  OVER THE COUNTER MEDICATION, Tumeric, Disp: , Rfl:   Allergies  Allergen Reactions  . Penicillins          Objective:   Physical Exam  General: AAO x3, NAD  Dermatological: Skin is warm, dry and supple bilateral. Nails x 10 are well manicured;  remaining integument appears unremarkable at this time. There are no open sores, no preulcerative lesions, no rash or signs of infection present.  Vascular: Dorsalis Pedis artery and Posterior Tibial artery pedal pulses are 2/4 bilateral with immedate capillary fill time. Pedal hair growth present. No varicosities and no lower extremity edema present bilateral. There is no pain with calf compression, swelling, warmth, erythema.   Neruologic: Grossly intact via light touch bilateral.Protective threshold with Semmes Wienstein monofilament intact to all pedal sites bilateral.  Negative Tinel sign.  Musculoskeletal: There is tenderness palpation on the navicular tuberosity of the right foot on the distal portion of the posterior tibial tendon along the insertion.  She is able to perform single and double heel rise.  There is no discomfort on the plantar medial tubercle of the calcaneus at the insertion of plantar fascia.  No pain along the Achilles tendon.  There is no pain at the ankle with ankle joint range of motion.  There is no other area tenderness identified at this time.  Muscular strength 5/5 in all groups tested bilateral.  Flatfoot deformity present.  Gait: Unassisted, Nonantalgic.      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 -Treatment options discussed  including all alternatives, risks, and complications -Etiology of symptoms were discussed -X-rays were obtained and reviewed with the patient.  There is a cyst present in the navicular tuberosity there is no evidence of acute fracture.  Also independently reviewed the x-rays from the urgent care which should reveal a bone cyst in the calcaneus as well as the distal tibia. -Continue ibuprofen I will order topical Voltaren gel.  We discussed stretching, rehab exercises for posterior tibial tendon dysfunction.  Were also going to check insurance coverage for custom orthotics.  Given the cysts  present I would order an MRI.  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 -Follow-up after MRI or sooner if needed.  Vivi BarrackMatthew R Wagoner DPM

## 2017-09-01 ENCOUNTER — Telehealth: Payer: Self-pay | Admitting: Podiatry

## 2017-09-01 ENCOUNTER — Telehealth: Payer: Self-pay | Admitting: *Deleted

## 2017-09-01 ENCOUNTER — Encounter: Payer: Self-pay | Admitting: *Deleted

## 2017-09-01 DIAGNOSIS — M779 Enthesopathy, unspecified: Secondary | ICD-10-CM

## 2017-09-01 DIAGNOSIS — M8560 Other cyst of bone, unspecified site: Secondary | ICD-10-CM

## 2017-09-01 NOTE — Telephone Encounter (Signed)
Pt needs a note for working stating that she can wear tennis shoes. Please give her a call.

## 2017-09-01 NOTE — Telephone Encounter (Signed)
Faxed note to Attn: Lianne MorisNicole Seidl.

## 2017-09-01 NOTE — Telephone Encounter (Signed)
Orders to J. Quintana, RN for pre-cert, faxed to [facility]. 

## 2017-09-01 NOTE — Telephone Encounter (Signed)
I informed pt I could write the note to wear athletic shoes until reevaluated after her MRI. Pt states fine and request fax to her at [phone].

## 2017-09-01 NOTE — Telephone Encounter (Signed)
-----   Message from Vivi BarrackMatthew R Wagoner, DPM sent at 09/01/2017  7:13 AM EDT ----- Can you please order an MRI of the right due to multiple bone cyst? She has pain at the navicular tuberosity so I guess it would be an ankle x-ray? If we can lets do the MRI with contrast. Thanks.

## 2017-10-01 ENCOUNTER — Encounter: Payer: Self-pay | Admitting: Family Medicine

## 2017-10-01 ENCOUNTER — Ambulatory Visit (INDEPENDENT_AMBULATORY_CARE_PROVIDER_SITE_OTHER): Payer: 59 | Admitting: Family Medicine

## 2017-10-01 ENCOUNTER — Telehealth: Payer: Self-pay | Admitting: *Deleted

## 2017-10-01 ENCOUNTER — Other Ambulatory Visit: Payer: Self-pay

## 2017-10-01 VITALS — BP 122/78 | HR 68 | Temp 98.7°F | Resp 12 | Ht 68.0 in | Wt 277.0 lb

## 2017-10-01 DIAGNOSIS — Z0000 Encounter for general adult medical examination without abnormal findings: Secondary | ICD-10-CM

## 2017-10-01 DIAGNOSIS — L0293 Carbuncle, unspecified: Secondary | ICD-10-CM

## 2017-10-01 DIAGNOSIS — Z720 Tobacco use: Secondary | ICD-10-CM | POA: Diagnosis not present

## 2017-10-01 DIAGNOSIS — Z30019 Encounter for initial prescription of contraceptives, unspecified: Secondary | ICD-10-CM

## 2017-10-01 DIAGNOSIS — L732 Hidradenitis suppurativa: Secondary | ICD-10-CM

## 2017-10-01 LAB — COMPLETE METABOLIC PANEL WITH GFR
AG Ratio: 1.5 (calc) (ref 1.0–2.5)
ALKALINE PHOSPHATASE (APISO): 51 U/L (ref 33–115)
ALT: 7 U/L (ref 6–29)
AST: 12 U/L (ref 10–30)
Albumin: 4 g/dL (ref 3.6–5.1)
BUN: 9 mg/dL (ref 7–25)
CALCIUM: 9.4 mg/dL (ref 8.6–10.2)
CO2: 26 mmol/L (ref 20–32)
CREATININE: 0.76 mg/dL (ref 0.50–1.10)
Chloride: 104 mmol/L (ref 98–110)
GFR, Est African American: 111 mL/min/{1.73_m2} (ref >=60)
GFR, Est Non African American: 95 mL/min/{1.73_m2} (ref >=60)
Globulin: 2.6 g/dL (calc) (ref 1.9–3.7)
Glucose, Bld: 85 mg/dL (ref 65–99)
POTASSIUM: 3.9 mmol/L (ref 3.5–5.3)
Sodium: 137 mmol/L (ref 135–146)
Total Bilirubin: 0.5 mg/dL (ref 0.2–1.2)
Total Protein: 6.6 g/dL (ref 6.1–8.1)

## 2017-10-01 LAB — CBC WITH DIFFERENTIAL/PLATELET
BASOS PCT: 0.6 %
Basophils Absolute: 39 cells/uL (ref 0–200)
Eosinophils Absolute: 169 cells/uL (ref 15–500)
Eosinophils Relative: 2.6 %
HEMATOCRIT: 40.7 % (ref 35.0–45.0)
Hemoglobin: 14.1 g/dL (ref 11.7–15.5)
LYMPHS ABS: 2841 {cells}/uL (ref 850–3900)
MCH: 29.7 pg (ref 27.0–33.0)
MCHC: 34.6 g/dL (ref 32.0–36.0)
MCV: 85.7 fL (ref 80.0–100.0)
MPV: 9.4 fL (ref 7.5–12.5)
Monocytes Relative: 6.6 %
NEUTROS PCT: 46.5 %
Neutro Abs: 3023 cells/uL (ref 1500–7800)
Platelets: 353 10*3/uL (ref 140–400)
RBC: 4.75 10*6/uL (ref 3.80–5.10)
RDW: 12.1 % (ref 11.0–15.0)
Total Lymphocyte: 43.7 %
WBC: 6.5 10*3/uL (ref 3.8–10.8)
WBCMIX: 429 {cells}/uL (ref 200–950)

## 2017-10-01 LAB — LIPID PANEL
CHOL/HDL RATIO: 2.6 (calc) (ref <5.0)
Cholesterol: 133 mg/dL (ref <200)
HDL: 51 mg/dL (ref >50)
LDL CHOLESTEROL (CALC): 69 mg/dL
NON-HDL CHOLESTEROL (CALC): 82 mg/dL (ref <130)
Triglycerides: 44 mg/dL (ref <150)

## 2017-10-01 LAB — PREGNANCY, URINE: Preg Test, Ur: NEGATIVE

## 2017-10-01 LAB — TSH: TSH: 0.48 m[IU]/L

## 2017-10-01 MED ORDER — MEDROXYPROGESTERONE ACETATE 150 MG/ML IM SUSP: 150.0000 mg | INTRAMUSCULAR | 2 refills | Status: DC

## 2017-10-01 MED ORDER — VARENICLINE TARTRATE 0.5 MG X 11 & 1 MG X 42 PO MISC: ORAL | 0 refills | Status: DC

## 2017-10-01 NOTE — Patient Instructions (Addendum)
Health Maintenance, Female Adopting a healthy lifestyle and getting preventive care can go a long way to promote health and wellness. Talk with your health care provider about what schedule of regular examinations is right for you. This is a good chance for you to check in with your provider about disease prevention and staying healthy. In between checkups, there are plenty of things you can do on your own. Experts have done a lot of research about which lifestyle changes and preventive measures are most likely to keep you healthy. Ask your health care provider for more information. Weight and diet Eat a healthy diet  Be sure to include plenty of vegetables, fruits, low-fat dairy products, and lean protein.  Do not eat a lot of foods high in solid fats, added sugars, or salt.  Get regular exercise. This is one of the most important things you can do for your health. ? Most adults should exercise for at least 150 minutes each week. The exercise should increase your heart rate and make you sweat (moderate-intensity exercise). ? Most adults should also do strengthening exercises at least twice a week. This is in addition to the moderate-intensity exercise.  Maintain a healthy weight  Body mass index (BMI) is a measurement that can be used to identify possible weight problems. It estimates body fat based on height and weight. Your health care provider can help determine your BMI and help you achieve or maintain a healthy weight.  For females 20 years of age and older: ? A BMI below 18.5 is considered underweight. ? A BMI of 18.5 to 24.9 is normal. ? A BMI of 25 to 29.9 is considered overweight. ? A BMI of 30 and above is considered obese.  Watch levels of cholesterol and blood lipids  You should start having your blood tested for lipids and cholesterol at 45 years of age, then have this test every 5 years.  You may need to have your cholesterol levels checked more often if: ? Your lipid or  cholesterol levels are high. ? You are older than 45 years of age. ? You are at high risk for heart disease.  Cancer screening Lung Cancer  Lung cancer screening is recommended for adults 55-80 years old who are at high risk for lung cancer because of a history of smoking.  A yearly low-dose CT scan of the lungs is recommended for people who: ? Currently smoke. ? Have quit within the past 15 years. ? Have at least a 30-pack-year history of smoking. A pack year is smoking an average of one pack of cigarettes a day for 1 year.  Yearly screening should continue until it has been 15 years since you quit.  Yearly screening should stop if you develop a health problem that would prevent you from having lung cancer treatment.  Breast Cancer  Practice breast self-awareness. This means understanding how your breasts normally appear and feel.  It also means doing regular breast self-exams. Let your health care provider know about any changes, no matter how small.  If you are in your 20s or 30s, you should have a clinical breast exam (CBE) by a health care provider every 1-3 years as part of a regular health exam.  If you are 40 or older, have a CBE every year. Also consider having a breast X-ray (mammogram) every year.  If you have a family history of breast cancer, talk to your health care provider about genetic screening.  If you are at high risk   for breast cancer, talk to your health care provider about having an MRI and a mammogram every year.  Breast cancer gene (BRCA) assessment is recommended for women who have family members with BRCA-related cancers. BRCA-related cancers include: ? Breast. ? Ovarian. ? Tubal. ? Peritoneal cancers.  Results of the assessment will determine the need for genetic counseling and BRCA1 and BRCA2 testing.  Cervical Cancer Your health care provider may recommend that you be screened regularly for cancer of the pelvic organs (ovaries, uterus, and  vagina). This screening involves a pelvic examination, including checking for microscopic changes to the surface of your cervix (Pap test). You may be encouraged to have this screening done every 3 years, beginning at age 22.  For women ages 56-65, health care providers may recommend pelvic exams and Pap testing every 3 years, or they may recommend the Pap and pelvic exam, combined with testing for human papilloma virus (HPV), every 5 years. Some types of HPV increase your risk of cervical cancer. Testing for HPV may also be done on women of any age with unclear Pap test results.  Other health care providers may not recommend any screening for nonpregnant women who are considered low risk for pelvic cancer and who do not have symptoms. Ask your health care provider if a screening pelvic exam is right for you.  If you have had past treatment for cervical cancer or a condition that could lead to cancer, you need Pap tests and screening for cancer for at least 20 years after your treatment. If Pap tests have been discontinued, your risk factors (such as having a new sexual partner) need to be reassessed to determine if screening should resume. Some women have medical problems that increase the chance of getting cervical cancer. In these cases, your health care provider may recommend more frequent screening and Pap tests.  Colorectal Cancer  This type of cancer can be detected and often prevented.  Routine colorectal cancer screening usually begins at 45 years of age and continues through 45 years of age.  Your health care provider may recommend screening at an earlier age if you have risk factors for colon cancer.  Your health care provider may also recommend using home test kits to check for hidden blood in the stool.  A small camera at the end of a tube can be used to examine your colon directly (sigmoidoscopy or colonoscopy). This is done to check for the earliest forms of colorectal  cancer.  Routine screening usually begins at age 33.  Direct examination of the colon should be repeated every 5-10 years through 45 years of age. However, you may need to be screened more often if early forms of precancerous polyps or small growths are found.  Skin Cancer  Check your skin from head to toe regularly.  Tell your health care provider about any new moles or changes in moles, especially if there is a change in a mole's shape or color.  Also tell your health care provider if you have a mole that is larger than the size of a pencil eraser.  Always use sunscreen. Apply sunscreen liberally and repeatedly throughout the day.  Protect yourself by wearing long sleeves, pants, a wide-brimmed hat, and sunglasses whenever you are outside.  Heart disease, diabetes, and high blood pressure  High blood pressure causes heart disease and increases the risk of stroke. High blood pressure is more likely to develop in: ? People who have blood pressure in the high end of  the normal range (130-139/85-89 mm Hg). ? People who are overweight or obese. ? People who are African American.  If you are 21-29 years of age, have your blood pressure checked every 3-5 years. If you are 3 years of age or older, have your blood pressure checked every year. You should have your blood pressure measured twice-once when you are at a hospital or clinic, and once when you are not at a hospital or clinic. Record the average of the two measurements. To check your blood pressure when you are not at a hospital or clinic, you can use: ? An automated blood pressure machine at a pharmacy. ? A home blood pressure monitor.  If you are between 17 years and 37 years old, ask your health care provider if you should take aspirin to prevent strokes.  Have regular diabetes screenings. This involves taking a blood sample to check your fasting blood sugar level. ? If you are at a normal weight and have a low risk for diabetes,  have this test once every three years after 45 years of age. ? If you are overweight and have a high risk for diabetes, consider being tested at a younger age or more often. Preventing infection Hepatitis B  If you have a higher risk for hepatitis B, you should be screened for this virus. You are considered at high risk for hepatitis B if: ? You were born in a country where hepatitis B is common. Ask your health care provider which countries are considered high risk. ? Your parents were born in a high-risk country, and you have not been immunized against hepatitis B (hepatitis B vaccine). ? You have HIV or AIDS. ? You use needles to inject street drugs. ? You live with someone who has hepatitis B. ? You have had sex with someone who has hepatitis B. ? You get hemodialysis treatment. ? You take certain medicines for conditions, including cancer, organ transplantation, and autoimmune conditions.  Hepatitis C  Blood testing is recommended for: ? Everyone born from [birth year] through 1963. ? Anyone with known risk factors for hepatitis C.  Sexually transmitted infections (STIs)  You should be screened for sexually transmitted infections (STIs) including gonorrhea and chlamydia if: ? You are sexually active and are younger than 45 years of age. ? You are older than 45 years of age and your health care provider tells you that you are at risk for this type of infection. ? Your sexual activity has changed since you were last screened and you are at an increased risk for chlamydia or gonorrhea. Ask your health care provider if you are at risk.  If you do not have HIV, but are at risk, it may be recommended that you take a prescription medicine daily to prevent HIV infection. This is called pre-exposure prophylaxis (PrEP). You are considered at risk if: ? You are sexually active and do not regularly use condoms or know the HIV status of your partner(s). ? You take drugs by injection. ? You are  sexually active with a partner who has HIV.  Talk with your health care provider about whether you are at high risk of being infected with HIV. If you choose to begin PrEP, you should first be tested for HIV. You should then be tested every 3 months for as long as you are taking PrEP. Pregnancy  If you are premenopausal and you may become pregnant, ask your health care provider about preconception counseling.  If you may become  pregnant, take 400 to 800 micrograms (mcg) of folic acid every day.  If you want to prevent pregnancy, talk to your health care provider about birth control (contraception). Osteoporosis and menopause  Osteoporosis is a disease in which the bones lose minerals and strength with aging. This can result in serious bone fractures. Your risk for osteoporosis can be identified using a bone density scan.  If you are 85 years of age or older, or if you are at risk for osteoporosis and fractures, ask your health care provider if you should be screened.  Ask your health care provider whether you should take a calcium or vitamin D supplement to lower your risk for osteoporosis.  Menopause may have certain physical symptoms and risks.  Hormone replacement therapy may reduce some of these symptoms and risks. Talk to your health care provider about whether hormone replacement therapy is right for you. Follow these instructions at home:  Schedule regular health, dental, and eye exams.  Stay current with your immunizations.  Do not use any tobacco products including cigarettes, chewing tobacco, or electronic cigarettes.  If you are pregnant, do not drink alcohol.  If you are breastfeeding, limit how much and how often you drink alcohol.  Limit alcohol intake to no more than 1 drink per day for nonpregnant women. One drink equals 12 ounces of beer, 5 ounces of wine, or 1 ounces of hard liquor.  Do not use street drugs.  Do not share needles.  Ask your health care  provider for help if you need support or information about quitting drugs.  Tell your health care provider if you often feel depressed.  Tell your health care provider if you have ever been abused or do not feel safe at home. This information is not intended to replace advice given to you by your health care provider. Make sure you discuss any questions you have with your health care provider. Document Released: 09/09/2010 Document Revised: 08/02/2015 Document Reviewed: 11/28/2014 Elsevier Interactive Patient Education  2018 [employer].   Medroxyprogesterone injection [Contraceptive] What is this medicine? MEDROXYPROGESTERONE (me DROX ee proe JES te rone) contraceptive injections prevent pregnancy. They provide effective birth control for 3 months. Depo-subQ Provera 104 is also used for treating pain related to endometriosis. This medicine may be used for other purposes; ask your health care provider or pharmacist if you have questions. COMMON BRAND NAME(S): Depo-Provera, Depo-subQ Provera 104 What should I tell my health care provider before I take this medicine? They need to know if you have any of these conditions: -frequently drink alcohol -asthma -blood vessel disease or a history of a blood clot in the lungs or legs -bone disease such as osteoporosis -breast cancer -diabetes -eating disorder (anorexia nervosa or bulimia) -high blood pressure -HIV infection or AIDS -kidney disease -liver disease -mental depression -migraine -seizures (convulsions) -stroke -tobacco smoker -vaginal bleeding -an unusual or allergic reaction to medroxyprogesterone, other hormones, medicines, foods, dyes, or preservatives -pregnant or trying to get pregnant -breast-feeding How should I use this medicine? Depo-Provera Contraceptive injection is given into a muscle. Depo-subQ Provera 104 injection is given under the skin. These injections are given by a health care professional. You must not be  pregnant before getting an injection. The injection is usually given during the first 5 days after the start of a menstrual period or 6 [place] after delivery of a baby. Talk to your pediatrician regarding the use of this medicine in children. Special care may be needed. These injections have  been used in female children who have started having menstrual periods. Overdosage: If you think you have taken too much of this medicine contact a poison control center or emergency room at once. NOTE: This medicine is only for you. Do not share this medicine with others. What if I miss a dose? Try not to miss a dose. You must get an injection once every 3 months to maintain birth control. If you cannot keep an appointment, call and reschedule it. If you wait longer than 13 [place] between Depo-Provera contraceptive injections or longer than 14 [place] between Depo-subQ Provera 104 injections, you could get pregnant. Use another method for birth control if you miss your appointment. You may also need a pregnancy test before receiving another injection. What may interact with this medicine? Do not take this medicine with any of the following medications: -bosentan This medicine may also interact with the following medications: -aminoglutethimide -antibiotics or medicines for infections, especially rifampin, rifabutin, rifapentine, and griseofulvin -aprepitant -barbiturate medicines such as phenobarbital or primidone -bexarotene -carbamazepine -medicines for seizures like ethotoin, felbamate, oxcarbazepine, phenytoin, topiramate -modafinil -St. John's wort This list may not describe all possible interactions. Give your health care provider a list of all the medicines, herbs, non-prescription drugs, or dietary supplements you use. Also tell them if you smoke, drink alcohol, or use illegal drugs. Some items may interact with your medicine. What should I watch for while using this medicine? This drug does not protect  you against HIV infection (AIDS) or other sexually transmitted diseases. Use of this product may cause you to lose calcium from your bones. Loss of calcium may cause weak bones (osteoporosis). Only use this product for more than 2 years if other forms of birth control are not right for you. The longer you use this product for birth control the more likely you will be at risk for weak bones. Ask your health care professional how you can keep strong bones. You may have a change in bleeding pattern or irregular periods. Many females stop having periods while taking this drug. If you have received your injections on time, your chance of being pregnant is very low. If you think you may be pregnant, see your health care professional as soon as possible. Tell your health care professional if you want to get pregnant within the next year. The effect of this medicine may last a long time after you get your last injection. What side effects may I notice from receiving this medicine? Side effects that you should report to your doctor or health care professional as soon as possible: -allergic reactions like skin rash, itching or hives, swelling of the face, lips, or tongue -breast tenderness or discharge -breathing problems -changes in vision -depression -feeling faint or lightheaded, falls -fever -pain in the abdomen, chest, groin, or leg -problems with balance, talking, walking -unusually weak or tired -yellowing of the eyes or skin Side effects that usually do not require medical attention (report to your doctor or health care professional if they continue or are bothersome): -acne -fluid retention and swelling -headache -irregular periods, spotting, or absent periods -temporary pain, itching, or skin reaction at site where injected -weight gain This list may not describe all possible side effects. Call your doctor for medical advice about side effects. You may report side effects to FDA at  1-800-FDA-1088. Where should I keep my medicine? This does not apply. The injection will be given to you by a health care professional. NOTE: This sheet is a  summary. It may not cover all possible information. If you have questions about this medicine, talk to your doctor, pharmacist, or health care provider.  2018 Elsevier/Gold Standard (2008-03-17 18:37:56)               Steps to Quit Smoking Smoking tobacco can be harmful to your health and can affect almost every organ in your body. Smoking puts you, and those around you, at risk for developing many serious chronic diseases. Quitting smoking is difficult, but it is one of the best things that you can do for your health. It is never too late to quit. What are the benefits of quitting smoking? When you quit smoking, you lower your risk of developing serious diseases and conditions, such as:  Lung cancer or lung disease, such as COPD.  Heart disease.  Stroke.  Heart attack.  Infertility.  Osteoporosis and bone fractures.  Additionally, symptoms such as coughing, wheezing, and shortness of breath may get better when you quit. You may also find that you get sick less often because your body is stronger at fighting off colds and infections. If you are pregnant, quitting smoking can help to reduce your chances of having a baby of low birth weight. How do I get ready to quit? When you decide to quit smoking, create a plan to make sure that you are successful. Before you quit:  Pick a date to quit. Set a date within the next two [place] to give you time to prepare.  Write down the reasons why you are quitting. Keep this list in places where you will see it often, such as on your bathroom mirror or in your car or wallet.  Identify the people, places, things, and activities that make you want to smoke (triggers) and avoid them. Make sure to take these actions: ? Throw away all cigarettes at home, at work, and in your  car. ? Throw away smoking accessories, such as Scientist, research (medical). ? Clean your car and make sure to empty the ashtray. ? Clean your home, including curtains and carpets.  Tell your family, friends, and coworkers that you are quitting. Support from your loved ones can make quitting easier.  Talk with your health care provider about your options for quitting smoking.  Find out what treatment options are covered by your health insurance.  What strategies can I use to quit smoking? Talk with your healthcare provider about different strategies to quit smoking. Some strategies include:  Quitting smoking altogether instead of gradually lessening how much you smoke over a period of time. Research shows that quitting "cold [place]" is more successful than gradually quitting.  Attending in-person counseling to help you build problem-solving skills. You are more likely to have success in quitting if you attend several counseling sessions. Even short sessions of 10 minutes can be effective.  Finding resources and support systems that can help you to quit smoking and remain smoke-free after you quit. These resources are most helpful when you use them often. They can include: ? Online chats with a Social worker. ? Telephone quitlines. ? Careers information officer. ? Support groups or group counseling. ? Text messaging programs. ? Mobile phone applications.  Taking medicines to help you quit smoking. (If you are pregnant or breastfeeding, talk with your health care provider first.) Some medicines contain nicotine and some do not. Both types of medicines help with cravings, but the medicines that include nicotine help to relieve withdrawal symptoms. Your health care provider may recommend: ?  Nicotine patches, gum, or lozenges. ? Nicotine inhalers or sprays. ? Non-nicotine medicine that is taken by mouth.  Talk with your health care provider about combining strategies, such as taking medicines while  you are also receiving in-person counseling. Using these two strategies together makes you more likely to succeed in quitting than if you used either strategy on its own. If you are pregnant or breastfeeding, talk with your health care provider about finding counseling or other support strategies to quit smoking. Do not take medicine to help you quit smoking unless told to do so by your health care provider. What things can I do to make it easier to quit? Quitting smoking might feel overwhelming at first, but there is a lot that you can do to make it easier. Take these important actions:  Reach out to your family and friends and ask that they support and encourage you during this time. Call telephone quitlines, reach out to support groups, or work with a counselor for support.  Ask people who smoke to avoid smoking around you.  Avoid places that trigger you to smoke, such as bars, parties, or smoke-break areas at work.  Spend time around people who do not smoke.  Lessen stress in your life, because stress can be a smoking trigger for some people. To lessen stress, try: ? Exercising regularly. ? Deep-breathing exercises. ? Yoga. ? Meditating. ? Performing a body scan. This involves closing your eyes, scanning your body from head to toe, and noticing which parts of your body are particularly tense. Purposefully relax the muscles in those areas.  Download or purchase mobile phone or tablet apps (applications) that can help you stick to your quit plan by providing reminders, tips, and encouragement. There are many free apps, such as QuitGuide from the [employer] Office manager for Disease Control and Prevention). You can find other support for quitting smoking (smoking cessation) through smokefree.gov and other websites.  How will I feel when I quit smoking? Within the first 24 hours of quitting smoking, you may start to feel some withdrawal symptoms. These symptoms are usually most noticeable 2-3 days after  quitting, but they usually do not last beyond 2-3 [place]. Changes or symptoms that you might experience include:  Mood swings.  Restlessness, anxiety, or irritation.  Difficulty concentrating.  Dizziness.  Strong cravings for sugary foods in addition to nicotine.  Mild weight gain.  Constipation.  Nausea.  Coughing or a sore throat.  Changes in how your medicines work in your body.  A depressed mood.  Difficulty sleeping (insomnia).  After the first 2-3 Howat of quitting, you may start to notice more positive results, such as:  Improved sense of smell and taste.  Decreased coughing and sore throat.  Slower heart rate.  Lower blood pressure.  Clearer skin.  The ability to breathe more easily.  Fewer sick days.  Quitting smoking is very challenging for most people. Do not get discouraged if you are not successful the first time. Some people need to make many attempts to quit before they achieve long-term success. Do your best to stick to your quit plan, and talk with your health care provider if you have any questions or concerns. This information is not intended to replace advice given to you by your health care provider. Make sure you discuss any questions you have with your health care provider. Document Released: 02/18/2001 Document Revised: 10/23/2015 Document Reviewed: 07/11/2014 Elsevier Interactive Patient Education  [employer].

## 2017-10-01 NOTE — Telephone Encounter (Signed)
"  This patient is on the schedule for Sunday for her MRI.  It's [facility] that it is pending a peer to peer.  I just wanted to make sure Dr. Ardelle AntonWagoner was aware that this is needed.  We need the authorization tomorrow by 5 pm so we do not have to reschedule this patient.  Give me a call with any questions."

## 2017-10-01 NOTE — Progress Notes (Signed)
Patient: Lorraine [name], Female    DOB: 1970-04-20, 47 y.o.   MRN: [id number] Visit Date: 10/01/2017  Today's Provider: Danelle Berry, PA-C   Chief Complaint  Patient presents with  . CPE    is fasting  . Discuss Birth Control    would like to discuss options  . Obesity    would like to discuss weight loss options   Subjective:    Annual physical exam  Lorraine [name] is a 47 y.o. female who presents today for health maintenance and complete physical. She feels well. She reports exercising 2-3 times a week with goal of 5x a week. She reports she is sleeping well.  -----------------------------------------------------------------  Obesity - BMI today 42.1, weight 277 lbs, she has been working with her PCP on weight loss.  She has lost 7 pounds since her most recent visit which was November 2018.  She states she has been continuing to eat whatever she wants but decreasing her portion sizes, she is also been starting to exercise with a goal of 5 times a week but on average she is exercising 2 times a week.  She states that she still needs to quit drinking Pepsi and she is drinking a gallon of water a day but she still having of difficult time cutting out soda.  She would like to try phentermine states that she has discussed this with her PCP at her last well visit and was told that if she could start losing weight on her own that her PCP would be willing to try it.  I have reviewed the most recent routine well visit from October 28, 2016, and there is no specific notation of this.  We will follow-up with PCP, Dr. Jeanice Lim, tomorrow when she is in clinic.  She does not think going to nutritional counseling will help her very much because she states "I know what to eat I just need to make the decision to do it".  Hidradenitis and recurrent boils, patient continues to have abscesses and skin infections that she noticed go along the edges of where her bra line undergarments me her skin.  She has tried  several different bras but they continue to recur.  She is having some difficulty with the appearance this is creating on her breasts and body.  She has taken antibiotics, done Hibiclens and Dial soap washes, and adjusted her garments but nothing seems to prevent them from reoccurring.  She has not been evaluated in the past by dermatology.  Patient is still continuing to smoke but does express a desire to stop smoking.  She was given prescription for Chantix in the past by her PCP but she was too afraid to take it after reading possible side effects or adverse effects.  She is concerned about having moods or suicidal thoughts or ideations.  She denies any personal medical history of mood disorder, depression, anxiety, denies SI, HI, AVH.  She is currently smoking about 10 cigarettes a day or half pack a day and does want to quit and does want a refill of the Chantix to try again.  Patient also would like to start birth control.  She is currently sexually active with one female partner, is using condoms.  Last week when the condoms broke and they did use Plan B.  Menses every 28 days, lasts 5-6 days, LMP 09/16/17.  She has not had birth control in several years but states that in the 90s she used Depo-Provera shot.  She does  understand because of smoking and her age that combined oral contraceptive pills that put her at high risk for blood clots.  She has not been to OB/GYN in some time.  She is not due for her Pap smear until 2020.  She denies personal history of thyroid disorder, there is family hx, little sister has Graves disease.     Review of Systems  Constitutional: Negative.  Negative for activity change, appetite change, fatigue and unexpected weight change.  HENT: Negative.   Eyes: Negative.   Respiratory: Negative.  Negative for shortness of breath.   Cardiovascular: Negative.  Negative for chest pain, palpitations and leg swelling.  Gastrointestinal: Negative.  Negative for abdominal pain and  blood in stool.  Endocrine: Negative.   Genitourinary: Negative.  Negative for decreased urine volume, difficulty urinating, dyspareunia, dysuria, flank pain, frequency, genital sores, hematuria, menstrual problem, pelvic pain, urgency, vaginal bleeding, vaginal discharge and vaginal pain.  Musculoskeletal: Negative.  Negative for arthralgias, gait problem, joint swelling and myalgias.  Skin: Negative.  Negative for color change, pallor and rash.  Allergic/Immunologic: Negative.   Neurological: Negative.  Negative for syncope and weakness.  Hematological: Negative.   Psychiatric/Behavioral: Negative.  Negative for confusion, dysphoric mood, self-injury and suicidal ideas. The patient is not nervous/anxious.   All other systems reviewed and are negative.   Social History      She  reports that she has been smoking.  She has a 12.00 pack-year smoking history. She has never used smokeless tobacco. She reports that she drinks about 0.6 oz of alcohol per week. She reports that she does not use drugs.       Social History   Socioeconomic History  . Marital status: Significant Other    Spouse name: Not on file  . Number of children: Not on file  . Years of education: Not on file  . Highest education level: Not on file  Occupational History  . Not on file  Social Needs  . Financial resource strain: Not on file  . Food insecurity:    Worry: Not on file    Inability: Not on file  . Transportation needs:    Medical: Not on file    Non-medical: Not on file  Tobacco Use  . Smoking status: Current Every Day Smoker    Packs/day: 0.50    Years: 24.00    Pack years: 12.00  . Smokeless tobacco: Never Used  Substance and Sexual Activity  . Alcohol use: Yes    Alcohol/week: 0.6 oz    Types: 1 Glasses of wine per week    Comment: SOCIALLY (TWICE A MONTH)  . Drug use: No  . Sexual activity: Yes    Birth control/protection: Condom  Lifestyle  . Physical activity:    Days per week: 2 days     Minutes per session: 30 min  . Stress: Not on file  Relationships  . Social connections:    Talks on phone: Not on file    Gets together: Not on file    Attends religious service: Not on file    Active member of club or organization: Not on file    Attends meetings of clubs or organizations: Not on file    Relationship status: Not on file  Other Topics Concern  . Not on file  Social History Narrative  . Not on file    Past Medical History:  Diagnosis Date  . Allergy    Seasonal     Patient Active Problem  List   Diagnosis Date Noted  . Recurrent boils 04/02/2011  . 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 Morbid obesity (HCC) 04/02/2011  . Tobacco user 04/02/2011    Past Surgical History:  Procedure Laterality Date  . CHOLECYSTECTOMY    . PILONIDAL CYST EXCISION      Family History        Family Status  Relation Name Status  . Father  Deceased  . Mother  Alive  . Sister  Alive  . Brother  Alive  . Emelda BrothersPat Aunt  (Not Specified)  . Oneal GroutPat Uncle  (Not Specified)  . MGM  (Not Specified)  . Daughter  Alive        Her family history includes Diabetes in her father, maternal grandmother, paternal aunt, and paternal uncle; Heart disease in her father; Kidney disease in her mother; Thyroid disease in her mother and sister.      Allergies  Allergen Reactions  . Penicillins      Current Outpatient Medications:  .  chlorhexidine (HIBICLENS) 4 % external liquid, Apply to skin during shower and rinse twice a week, Disp: 236 mL, Rfl: 2 .  Multiple Vitamin (MULTIVITAMIN PO), Take by mouth. Natures made women's mvi , Disp: , Rfl:  .  OVER THE COUNTER MEDICATION, Tumeric, Disp: , Rfl:  .  medroxyPROGESTERone (DEPO-PROVERA) 150 MG/ML injection, Inject 1 mL (150 mg total) into the muscle every 3 (three) months., Disp: 1 mL, Rfl: 2 .  varenicline (CHANTIX STARTING MONTH PAK) 0.5 MG X 11 & 1 MG X 42 tablet, Take 0.5 mg tablet by mouth 1x daily for 3 days, then increase to 0.5 mg tablet  2x daily for 4 days, then increase to 1 mg tablet 2x daily., Disp: 53 tablet, Rfl: 0   Patient Care Team: Salley Scarleturham, Kawanta F, MD as PCP - General (Family Medicine)      Objective:   Vitals: BP 122/78   Pulse 68   Temp 98.7 F (37.1 C) (Oral)   Resp 12   Ht 5\' 8"  (1.727 m)   Wt 277 lb (125.6 kg)   LMP 09/16/2017 Comment: regular  SpO2 97%   BMI 42.12 kg/m    Vitals:   10/01/17 0927  BP: 122/78  Pulse: 68  Resp: 12  Temp: 98.7 F (37.1 C)  TempSrc: Oral  SpO2: 97%  Weight: 277 lb (125.6 kg)  Height: 5\' 8"  (1.727 m)     Physical Exam  Constitutional: She is oriented to person, place, and time. She appears well-developed and well-nourished.  Non-toxic appearance. No distress.  HENT:  Head: Normocephalic and atraumatic.  Right Ear: External ear normal.  Left Ear: External ear normal.  Nose: Nose normal.  Mouth/Throat: Uvula is midline, oropharynx is clear and moist and mucous membranes are normal.  Eyes: Pupils are equal, round, and reactive to light. Conjunctivae, EOM and lids are normal.  Neck: Normal range of motion and phonation normal. Neck supple. No tracheal deviation present. No thyromegaly present.  Cardiovascular: Normal rate, regular rhythm, normal heart sounds, intact distal pulses and normal pulses. Exam reveals no gallop and no friction rub.  No murmur heard. Pulses:      Radial pulses are 2+ on the right side, and 2+ on the left side.       Posterior tibial pulses are 2+ on the right side, and 2+ on the left side.  No lower extremity edema  Pulmonary/Chest: Effort normal and breath sounds normal. No stridor. No respiratory distress. She has no  wheezes. She has no rhonchi. She has no rales. She exhibits no tenderness.  Abdominal: Soft. Normal appearance and bowel sounds are normal. She exhibits no distension. There is no tenderness. There is no rebound and no guarding.  Musculoskeletal: Normal range of motion. She exhibits no edema or deformity.    Lymphadenopathy:    She has no cervical adenopathy.  Neurological: She is alert and oriented to person, place, and time. She exhibits normal muscle tone. Coordination and gait normal.  Skin: Skin is warm, dry and intact. Capillary refill takes less than 2 seconds. No rash noted. She is not diaphoretic. No pallor.  Raised hyperpigmented scarred tissue along breasts and to b/l axilla  Psychiatric: She has a normal mood and affect. Her speech is normal and behavior is normal.  Nursing note and vitals reviewed.    Depression Screen PHQ 2/9 Scores 10/01/2017 12/22/2016 10/29/2015  PHQ - 2 Score 0 0 0  PHQ- 9 Score - - 0     Office Visit from 10/01/2017 in [facility]  AUDIT-C Score  1       Assessment & Plan:     Routine Health Maintenance and Physical Exam  Exercise Activities and Dietary recommendations Goals    . Exercise 3x per week (30 min per time)       Immunization History  Administered Date(s) Administered  . Influenza,inj,Quad PF,6+ Mos 12/22/2016  . Pneumococcal Polysaccharide-23 10/29/2015    Health Maintenance  Topic Date Due  . TETANUS/TDAP  04/07/2017  . HIV Screening  10/02/2018 (Originally 01/20/1986)  . INFLUENZA VACCINE  10/08/2017  . PAP SMEAR  10/29/2018     Discussed health benefits of physical activity, and encouraged her to engage in regular exercise appropriate for her age and condition.  Patient declined nutritional referral.   --------------------------------------------------------------------    ICD-10-CM   1. Routine general medical examination at a health care facility Z00.00 CBC with Differential/Platelet    Lipid panel    COMPLETE METABOLIC PANEL WITH GFR    TSH  2. Encounter for initial prescription of contraceptives, unspecified contraceptive Z30.019 Pregnancy, urine    medroxyPROGESTERone (DEPO-PROVERA) 150 MG/ML injection   depo provera - safe with age, weight and current smoker, reviewed medication,  administration, SE.  3. Morbid obesity (HCC) E66.01 Lipid panel    TSH   Diet, exercise increase days/week, cut out soda.  Will defer to PCP re: phentermine.  Pt refused nutritional referral  4. Tobacco user Z72.0 varenicline (CHANTIX STARTING MONTH PAK) 0.5 MG X 11 & 1 MG X 42 tablet    Ambulatory referral to Smoking Cessation Program    DISCONTINUED: varenicline (CHANTIX STARTING MONTH PAK) 0.5 MG X 11 & 1 MG X 42 tablet   desires to quit, requests refill of chantix, counseled re: medication, quitting, referred to smoking cessation, printed materials provided  5. Hidradenitis axillaris L73.2 Ambulatory referral to Dermatology  6. Recurrent boils L02.93 Ambulatory referral to Dermatology    Tdap due Pneumvax - PCP gave last year due to smoking, will consult if she needs second dose after one year (10/28/17) Pt to return to get depo provera injection after next period (instructed to come within 7 days)     Danelle Berry, PA-C 10/01/17 1:44 PM  [facility]

## 2017-10-02 NOTE — Telephone Encounter (Signed)
Dr. Ardelle AntonWagoner received PA for MRI right ankle with and without contrast, PA: [phone], valid to 11/16/2017. I informed Verta EllenJennifer - [facility] of MRI authorization and faxed to [phone] as she requested.

## 2017-10-02 NOTE — Progress Notes (Signed)
Please notify pt- Labs look excellent.

## 2017-10-02 NOTE — Telephone Encounter (Signed)
I have never been told I needed to do a peer to peer and have no information for this if someone could please forward it to me.

## 2017-10-04 ENCOUNTER — Other Ambulatory Visit: Payer: 59

## 2017-10-04 ENCOUNTER — Ambulatory Visit
Admission: RE | Admit: 2017-10-04 | Discharge: 2017-10-04 | Disposition: A | Discharge status: Home / Self Care | Payer: 59 | Source: Ambulatory Visit | Attending: Podiatry | Admitting: Podiatry

## 2017-10-04 DIAGNOSIS — M779 Enthesopathy, unspecified: Secondary | ICD-10-CM

## 2017-10-04 DIAGNOSIS — M8560 Other cyst of bone, unspecified site: Secondary | ICD-10-CM

## 2017-10-04 DIAGNOSIS — R600 Localized edema: Secondary | ICD-10-CM | POA: Diagnosis not present

## 2017-10-04 MED ORDER — GADOBENATE DIMEGLUMINE 529 MG/ML IV SOLN
20.0000 mL | Freq: Once | INTRAVENOUS | Status: AC | PRN
  Administered 2017-10-04: 20 mL via INTRAVENOUS

## 2017-10-07 ENCOUNTER — Other Ambulatory Visit: Payer: Self-pay | Admitting: Family Medicine

## 2017-10-07 MED ORDER — PHENTERMINE HCL 37.5 MG PO TABS: ORAL_TABLET | ORAL | 0 refills | Status: DC

## 2017-10-07 NOTE — Progress Notes (Signed)
Can we please call the patient and review the plan per Dr. Jeanice Limurham.  I have prescribed the meds for 6 Geister +, she will need 6 week f/up appt. It is a stimulant medication and can cause rapid heart rate, elevated BP, palpitatios, dizziness headaches, insomnia, dry mouth.

## 2017-10-09 ENCOUNTER — Ambulatory Visit (INDEPENDENT_AMBULATORY_CARE_PROVIDER_SITE_OTHER): Payer: 59

## 2017-10-09 DIAGNOSIS — Z309 Encounter for contraceptive management, unspecified: Secondary | ICD-10-CM

## 2017-10-09 MED ORDER — MEDROXYPROGESTERONE ACETATE 150 MG/ML IM SUSP
150.0000 mg | Freq: Once | INTRAMUSCULAR | Status: AC
  Administered 2017-10-09: 150 mg via INTRAMUSCULAR

## 2017-10-09 NOTE — Progress Notes (Signed)
Patient was in office for Depo Injection. Patient received injection in her left deltoid. Patient tolerated well

## 2017-10-12 ENCOUNTER — Telehealth: Payer: Self-pay | Admitting: Podiatry

## 2017-10-12 NOTE — Telephone Encounter (Signed)
Left message informing pt she would benefit from an appt to discuss the right ankle pain and the MRI results.

## 2017-10-12 NOTE — Telephone Encounter (Signed)
Pt was calling back for MRI results. Dr. Ardelle AntonWagoner called her on Friday after 5:00pm. She is also having severe pain, weight bearing on right ankle.

## 2017-10-21 DIAGNOSIS — L732 Hidradenitis suppurativa: Secondary | ICD-10-CM | POA: Diagnosis not present

## 2017-10-23 ENCOUNTER — Ambulatory Visit: Payer: 59 | Admitting: Podiatry

## 2017-10-27 ENCOUNTER — Ambulatory Visit: Payer: 59 | Admitting: Podiatry

## 2017-11-03 ENCOUNTER — Ambulatory Visit: Payer: 59 | Admitting: Family Medicine

## 2017-11-06 ENCOUNTER — Ambulatory Visit: Payer: 59 | Admitting: Podiatry

## 2017-11-06 ENCOUNTER — Encounter: Payer: Self-pay | Admitting: Podiatry

## 2017-11-06 DIAGNOSIS — M722 Plantar fascial fibromatosis: Secondary | ICD-10-CM | POA: Diagnosis not present

## 2017-11-06 DIAGNOSIS — M76821 Posterior tibial tendinitis, right leg: Secondary | ICD-10-CM

## 2017-11-06 NOTE — Progress Notes (Signed)
Subjective: [age] year old female presents the office today for follow-up evaluation of right ankle pain as well as for pain to her feet in general.  She says that she is doing much better she said no pain to the ankle.  She states that she occasionally gets a pain to the heel but not experience the pain today.  No recent injury or falls and no acute changes since the last saw her.  She states that try to wear more supportive shoes been helpful for her. Denies any systemic complaints such as fevers, chills, nausea, vomiting. No acute changes since last appointment, and no other complaints at this time.   Objective: AAO x3, NAD DP/PT pulses palpable bilaterally, CRT less than 3 seconds At this time there is no tenderness palpation on plantar medial tubercle of the calcaneus insertion upon the fascia.  No pain on the course of the plantar fascia.  Plantar fascia, Achilles tendon appear to be intact.  No pain on the navicular tuberosity in the right foot or along the course the posterior tibial tendon.  Achilles tendon appear to be intact.  No area pinpoint tenderness.  No edema, erythema.  Flatfoot deformities present. No open lesions or pre-ulcerative lesions.  No pain with calf compression, swelling, warmth, erythema  Assessment: Resolving right ankle pain, plantar fasciitis  Plan: -All treatment options discussed with the patient including all alternatives, risks, complications.  -Overall she is doing better.  Making long-term I think she will benefit from a custom orthotic.  Check insurance coverage for her.  Continue with stretching exercises daily as well as wearing supportive shoes.  I will have her follow-up with Raiford Nobleick after we let her know her with insurance coverage. -Patient encouraged to call the office with any questions, concerns, change in symptoms.   Vivi BarrackMatthew R Adalberto Metzgar DPM

## 2017-11-16 ENCOUNTER — Other Ambulatory Visit: Payer: Self-pay | Admitting: *Deleted

## 2017-11-19 ENCOUNTER — Other Ambulatory Visit: Payer: 59 | Admitting: Orthotics

## 2017-11-19 DIAGNOSIS — M76821 Posterior tibial tendinitis, right leg: Secondary | ICD-10-CM | POA: Diagnosis not present

## 2017-11-19 DIAGNOSIS — M722 Plantar fascial fibromatosis: Secondary | ICD-10-CM | POA: Diagnosis not present

## 2017-11-24 ENCOUNTER — Encounter: Payer: Self-pay | Admitting: Family Medicine

## 2017-11-24 ENCOUNTER — Other Ambulatory Visit: Payer: Self-pay

## 2017-11-24 ENCOUNTER — Ambulatory Visit: Payer: 59 | Admitting: Family Medicine

## 2017-11-24 VITALS — BP 128/82 | HR 82 | Temp 98.4°F | Resp 14 | Ht 68.0 in | Wt 261.0 lb

## 2017-11-24 DIAGNOSIS — Z6839 Body mass index (BMI) 39.0-39.9, adult: Secondary | ICD-10-CM

## 2017-11-24 DIAGNOSIS — Z720 Tobacco use: Secondary | ICD-10-CM

## 2017-11-24 DIAGNOSIS — E669 Obesity, unspecified: Secondary | ICD-10-CM

## 2017-11-24 MED ORDER — PHENTERMINE HCL 37.5 MG PO TABS: 37.5000 mg | ORAL_TABLET | Freq: Every day | ORAL | 0 refills | Status: DC

## 2017-11-24 NOTE — Assessment & Plan Note (Signed)
Successful weightloss with first 6 Junious of phentermine Continue med 37.5 mg daily in the am for 1-2 months Recheck in 2 months Healthy diet and exercise encouraged

## 2017-11-24 NOTE — Progress Notes (Signed)
Patient ID: Lorraine [name], female    DOB: [birth year], 47 y.o.   MRN: [id number]  PCP: Salley Scarlet, MD  Chief Complaint  Patient presents with  . Follow-up    phentermine- is not fasting    Subjective:   Lorraine [name] is a 47 y.o. female, presents to clinic with CC of morbid obesity and medication management and recheck for weightloss with phentermine.  She started meds 6 [place] ago.  She has been taking prior to eating breakfast.  She denies rapid heart rate, CP, SOB, agitation, difficulty sleeping.  It curbs appetite.  Is only eating about half her food, she stopped extra snacking, does not feel as hungry.  She rarely eats breakfast.  She still has not cut out pepsi, drinking 3 cans/ day and 3 bottles of water/d.  Only thing the has noticed was sweating more than normal.  Not exercising.  Not tracking calories.    She has lost weight since starting meds: Wt Readings from Last 5 Encounters:  11/24/17 261 lb (118.4 kg)  10/01/17 277 lb (125.6 kg)  01/13/17 284 lb (128.8 kg)  12/22/16 283 lb (128.4 kg)  04/07/16 274 lb 9.6 oz (124.6 kg)  Body mass index is 39.68 kg/m.  Lost 16 lbs.  BMI has decreased and she is now in obese category, not morbidly obese.  She wants to continue with medicine.    She is also trying to stop smoking.  Had chantix prescribed for her in the past, started taking the started pack 2 days ago, no SE so far.    Patient Active Problem List   Diagnosis Date Noted  . Recurrent boils 04/02/2011  . 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 Morbid obesity (HCC) 04/02/2011  . Tobacco user 04/02/2011     Prior to Admission medications   Medication Sig Start Date End Date Taking? Authorizing Provider  chlorhexidine (HIBICLENS) 4 % external liquid Apply to skin during shower and rinse twice a week 12/22/16  Yes Snyder, Velna Hatchet, MD  medroxyPROGESTERone (DEPO-PROVERA) 150 MG/ML injection Inject 1 mL (150 mg total) into the muscle every 3 (three) months. 10/01/17   Yes Danelle Berry, PA-C  Multiple Vitamin (MULTIVITAMIN PO) Take by mouth. [facility] made women's mvi    Yes [provider]  phentermine (ADIPEX-P) 37.5 MG tablet Take 1/2 tab PO QAC breakfast x 2 wks, then take 1 tab PO QAC breakfast 10/07/17  Yes Danelle Berry, PA-C  varenicline (CHANTIX STARTING MONTH PAK) 0.5 MG X 11 & 1 MG X 42 tablet Take 0.5 mg tablet by mouth 1x daily for 3 days, then increase to 0.5 mg tablet 2x daily for 4 days, then increase to 1 mg tablet 2x daily. 10/01/17  Yes Danelle Berry, PA-C     Allergies  Allergen Reactions  . Penicillins      Family History  Problem Relation Age of Onset  . Heart disease Father   . Diabetes Father   . Thyroid disease Mother        Pituitary tumor  . Kidney disease Mother        renal tumor- benign  . Thyroid disease Sister   . Diabetes Paternal Aunt   . Diabetes Paternal Uncle   . Diabetes Maternal Grandmother      Social History   Socioeconomic History  . Marital status: Significant Other    Spouse name: Not on file  . Number of children: Not on file  . Years of education: Not on file  .  Highest education level: Not on file  Occupational History  . Not on file  Social Needs  . Financial resource strain: Not on file  . Food insecurity:    Worry: Not on file    Inability: Not on file  . Transportation needs:    Medical: Not on file    Non-medical: Not on file  Tobacco Use  . Smoking status: Current Every Day Smoker    Packs/day: 0.50    Years: 24.00    Pack years: 12.00  . Smokeless tobacco: Never Used  Substance and Sexual Activity  . Alcohol use: Yes    Alcohol/week: 1.0 standard drinks    Types: 1 Glasses of wine per week    Comment: SOCIALLY (TWICE A MONTH)  . Drug use: No  . Sexual activity: Yes    Birth control/protection: Condom  Lifestyle  . Physical activity:    Days per week: 2 days    Minutes per session: 30 min  . Stress: Not on file  Relationships  . Social connections:    Talks on  phone: Not on file    Gets together: Not on file    Attends religious service: Not on file    Active member of club or organization: Not on file    Attends meetings of clubs or organizations: Not on file    Relationship status: Not on file  . Intimate partner violence:    Fear of current or ex partner: Not on file    Emotionally abused: Not on file    Physically abused: Not on file    Forced sexual activity: Not on file  Other Topics Concern  . Not on file  Social History Narrative  . Not on file     Review of Systems  Constitutional: Negative.  Negative for activity change, appetite change, chills, fatigue and unexpected weight change.  HENT: Negative.   Eyes: Negative.   Respiratory: Negative.  Negative for chest tightness and shortness of breath.   Cardiovascular: Negative.  Negative for chest pain, palpitations and leg swelling.  Gastrointestinal: Negative.  Negative for abdominal pain, blood in stool, diarrhea, nausea and vomiting.  Endocrine: Negative.   Genitourinary: Negative.   Musculoskeletal: Negative.  Negative for arthralgias, gait problem, joint swelling and myalgias.  Skin: Negative.  Negative for color change, pallor and rash.  Allergic/Immunologic: Negative.   Neurological: Negative.  Negative for dizziness, syncope, weakness, light-headedness, numbness and headaches.  Hematological: Negative.   Psychiatric/Behavioral: Negative.  Negative for agitation, behavioral problems, confusion, decreased concentration, dysphoric mood, hallucinations, self-injury and suicidal ideas. The patient is not nervous/anxious and is not hyperactive.        Objective:    Vitals:   11/24/17 0905  BP: 128/82  Pulse: 82  Resp: 14  Temp: 98.4 F (36.9 C)  TempSrc: Oral  SpO2: 99%  Weight: 261 lb (118.4 kg)  Height: 5\' 8"  (1.727 m)      Physical Exam  Constitutional: She appears well-developed and well-nourished. No distress.  HENT:  Head: Normocephalic and atraumatic.    Nose: Nose normal.  Mouth/Throat: Oropharynx is clear and moist.  Eyes: Conjunctivae are normal. Right eye exhibits no discharge. Left eye exhibits no discharge.  Neck: Normal range of motion. Neck supple. No tracheal deviation present.  Cardiovascular: Normal rate, regular rhythm, normal heart sounds and intact distal pulses. Exam reveals no gallop and no friction rub.  No murmur heard. Pulmonary/Chest: Effort normal and breath sounds normal. No stridor. No respiratory distress. She  has no wheezes. She has no rales. She exhibits no tenderness.  Abdominal: Soft. Bowel sounds are normal. She exhibits no distension. There is no tenderness.  Musculoskeletal: Normal range of motion.  Neurological: She is alert. She exhibits normal muscle tone. Coordination normal.  Skin: Skin is warm and dry. Capillary refill takes less than 2 seconds. No rash noted. She is not diaphoretic.  Psychiatric: She has a normal mood and affect. Her behavior is normal. Judgment and thought content normal.  Nursing note and vitals reviewed.         Assessment & Plan:    Problem List Items Addressed This Visit      Other   Morbid obesity (HCC) - Primary    Successful weightloss with first 6 [place] of phentermine Continue med 37.5 mg daily in the am for 1-2 months Recheck in 2 months Healthy diet and exercise encouraged      Relevant Medications   phentermine (ADIPEX-P) 37.5 MG tablet   phentermine (ADIPEX-P) 37.5 MG tablet (Start on 12/24/2017)   Tobacco user      No med SE reported, successful weight loss since starting.  Meds refilled for the next two months.  Encouraged pt to start tracking food, net calories, add protein in diet, specifically to eat breakfast and start exercising.  AHA exercise recommendations printed and reviewed.    She has started chantix this week since she was feeling good.  No reported SE.  Gave her a pamphlet about chantix, encouraged her to get meds in system for a week to 2  [place] and then pick a quit date.   Smoking cessation instruction/counseling given: reviewed/ counseled patient on the dangers of tobacco use, advised patient to stop smoking, and reviewed strategies to maximize success - pt ready to quit.  Discussed medication, how to take, SE, strategies to quit, printed info given and reviewed.  Tobacco Counseling and smoking cessation with medication tx discussed for at least 5 min during this encounter   Pt was in a great mood, she appeared very happy and motivated, congratulated her on weightloss.  F/up in 2 months, sooner as needed.  Danelle Berry, PA-C 11/24/17 1:14 PM

## 2017-11-24 NOTE — Patient Instructions (Signed)
American Heart Association (AHA) Exercise Recommendation  Being physically active is important to prevent heart disease and stroke, the nation's No. 1and No. 5killers. To improve overall cardiovascular health, we suggest at least 150 minutes per week of moderate exercise or 75 minutes per week of vigorous exercise (or a combination of moderate and vigorous activity). Thirty minutes a day, five times a week is an easy goal to remember. You will also experience benefits even if you divide your time into two or three segments of 10 to 15 minutes per day.  For people who would benefit from lowering their blood pressure or cholesterol, we recommend 40 minutes of aerobic exercise of moderate to vigorous intensity three to four times a week to lower the risk for heart attack and stroke.  Physical activity is anything that makes you move your body and burn calories.  This includes things like climbing stairs or playing sports. Aerobic exercises benefit your heart, and include walking, jogging, swimming or biking. Strength and stretching exercises are best for overall stamina and flexibility.  The simplest, positive change you can make to effectively improve your heart health is to start walking. It's enjoyable, free, easy, social and great exercise. A walking program is flexible and boasts high success rates because people can stick with it. It's easy for walking to become a regular and satisfying part of life.   For Overall Cardiovascular Health:  At least 30 minutes of moderate-intensity aerobic activity at least 5 days per week for a total of 150  OR   At least 25 minutes of vigorous aerobic activity at least 3 days per week for a total of 75 minutes; or a combination of moderate- and vigorous-intensity aerobic activity  AND   Moderate- to high-intensity muscle-strengthening activity at least 2 days per week for additional health benefits.  For Lowering Blood Pressure and Cholesterol  An  average 40 minutes of moderate- to vigorous-intensity aerobic activity 3 or 4 times per week  What if I can't make it to the time goal? Something is always better than nothing! And everyone has to start somewhere. Even if you've been sedentary for years, today is the day you can begin to make healthy changes in your life. If you don't think you'll make it for 30 or 40 minutes, set a reachable goal for today. You can work up toward your overall goal by increasing your time as you get stronger. Don't let all-or-nothing thinking rob you of doing what you can every day.  Source:http://www.heart.org     Phentermine orally disintegrating tablets (ODT) What is this medicine? PHENTERMINE (FEN ter meen) decreases your appetite. It is used with a reduced calorie diet and exercise to help you lose weight. This medicine may be used for other purposes; ask your health care provider or pharmacist if you have questions. COMMON BRAND NAME(S): Suprenza What should I tell my health care provider before I take this medicine? They need to know if you have any of these conditions: -agitation -glaucoma -heart disease -high blood pressure -history of substance abuse -lung disease called Primary Pulmonary Hypertension (PPH) -taken an MAOI like Carbex, Eldepryl, Marplan, Nardil, or Parnate in last 14 days -thyroid disease -an unusual or allergic reaction to phentermine, other medicines, foods, dyes, or preservatives -pregnant or trying to get pregnant -breast-feeding How should I use this medicine? Take this medicine by mouth. Follow the directions on the prescription label. The tablets should stay in the bottle until immediately before you take your dose.  Use dry hands to remove the dose from the bottle. Do not cut or split the tablets in half. Place the tablet in the mouth and allow it to dissolve, and then swallow. While you may take these tablets with water, it is not necessary to do so. Take your doses at  regular intervals. Do not take your medicine more often than directed. Do not stop taking except on the advice of your doctor or health care professional. Talk to your pediatrician regarding the use of this medicine in children. Special care may be needed. Overdosage: If you think you have taken too much of this medicine contact a poison control center or emergency room at once. NOTE: This medicine is only for you. Do not share this medicine with others. What if I miss a dose? If you miss a dose, take it as soon as you can. If it is almost time for your next dose, take only that dose. Do not take double or extra doses. What may interact with this medicine? Do not take this medicine with any of the following medications: -duloxetine -MAOIs like Carbex, Eldepryl, Marplan, Nardil, and Parnate -medicines for colds or breathing difficulties like pseudoephedrine or phenylephrine -procarbazine -sibutramine -SSRIs like citalopram, escitalopram, fluoxetine, fluvoxamine, paroxetine, and sertraline -stimulants like dexmethylphenidate, methylphenidate or modafinil -venlafaxine This medicine may also interact with the following medications: -medicines for diabetes This list may not describe all possible interactions. Give your health care provider a list of all the medicines, herbs, non-prescription drugs, or dietary supplements you use. Also tell them if you smoke, drink alcohol, or use illegal drugs. Some items may interact with your medicine. What should I watch for while using this medicine? Notify your physician immediately if you become short of breath while doing your normal activities. Do not take this medicine within 6 hours of bedtime. It can keep you from getting to sleep. Avoid drinks that contain caffeine and try to stick to a regular bedtime every night. This medicine is intended to be used in addition to a healthy diet and exercise. The best results are achieved this way. This medicine is  only indicated for short-term use. Eventually your weight loss may level out. At that point, the drug will only help you maintain your new weight. Do not increase or in any way change your dose without consulting your doctor. You may get drowsy or dizzy. Do not drive, use machinery, or do anything that needs mental alertness until you know how this medicine affects you. Do not stand or sit up quickly, especially if you are an older patient. This reduces the risk of dizzy or fainting spells. Alcohol may increase dizziness and drowsiness. Avoid alcoholic drinks. What side effects may I notice from receiving this medicine? Side effects that you should report to your doctor or health care professional as soon as possible: -chest pain, palpitations -depression or severe changes in mood -increased blood pressure -irritability -nervousness or restlessness -severe dizziness -shortness of breath -problems urinating -unusual swelling of the legs -vomiting Side effects that usually do not require medical attention (report to your doctor or health care professional if they continue or are bothersome): -blurred vision or other eye problems -changes in sexual ability or desire -constipation or diarrhea -difficulty sleeping -dry mouth or unpleasant taste -headache -nausea This list may not describe all possible side effects. Call your doctor for medical advice about side effects. You may report side effects to FDA at 1-800-FDA-1088. Where should I keep my medicine? Keep out  of the reach of children. This medicine can be abused. Keep your medicine in a safe place to protect it from theft. Do not share this medicine with anyone. Selling or giving away this medicine is dangerous and against the law. This medicine may cause accidental overdose and death if taken by other adults, children, or pets. Mix any unused medicine with a substance like cat litter or coffee grounds. Then throw the medicine away in a  sealed container like a sealed bag or a coffee can with a lid. Do not use the medicine after the expiration date. Store at room temperature between 20 and 25 degrees C (68 and 77 degrees F). Keep container tightly closed. NOTE: This sheet is a summary. It may not cover all possible information. If you have questions about this medicine, talk to your doctor, pharmacist, or health care provider.  2018 Elsevier/Gold Standard (2013-11-15 16:18:47)

## 2017-12-04 ENCOUNTER — Other Ambulatory Visit: Payer: Self-pay | Admitting: Family Medicine

## 2017-12-08 NOTE — Telephone Encounter (Signed)
Can we call and follow up on this?   Phentermine was refilled on 11/24/17 with #30 Refill ordered with do not fill date until 12/24/17 She should not be out of meds  Can we please verify the pharmacy has Rx and last time pt filled it?  Thank you

## 2017-12-10 ENCOUNTER — Other Ambulatory Visit: Payer: 59 | Admitting: Orthotics

## 2017-12-14 ENCOUNTER — Ambulatory Visit (HOSPITAL_COMMUNITY)
Admission: EM | Admit: 2017-12-14 | Discharge: 2017-12-14 | Disposition: A | Discharge status: Home / Self Care | Payer: 59 | Attending: Family Medicine | Admitting: Family Medicine

## 2017-12-14 ENCOUNTER — Other Ambulatory Visit: Payer: Self-pay

## 2017-12-14 ENCOUNTER — Encounter (HOSPITAL_COMMUNITY): Payer: Self-pay | Admitting: Emergency Medicine

## 2017-12-14 DIAGNOSIS — N760 Acute vaginitis: Secondary | ICD-10-CM | POA: Diagnosis not present

## 2017-12-14 DIAGNOSIS — F17210 Nicotine dependence, cigarettes, uncomplicated: Secondary | ICD-10-CM | POA: Diagnosis not present

## 2017-12-14 DIAGNOSIS — N898 Other specified noninflammatory disorders of vagina: Secondary | ICD-10-CM | POA: Diagnosis present

## 2017-12-14 DIAGNOSIS — Z79899 Other long term (current) drug therapy: Secondary | ICD-10-CM | POA: Diagnosis not present

## 2017-12-14 MED ORDER — METRONIDAZOLE 0.75 % VA GEL: 1.0000 | Freq: Every day | VAGINAL | 0 refills | Status: AC

## 2017-12-14 NOTE — ED Provider Notes (Signed)
[facility]    CSN: [id number] Arrival date & time: 12/14/17  1728     History   Chief Complaint Chief Complaint  Patient presents with  . Vaginal Discharge    HPI Lorraine [name] is a 47 y.o. female.   Lorraine [name] presents with complaints of malodorous vaginal discharge she noted two days ago. States she had a recent period which has been followed by some spotting, so she is uncertain if the discharge is brown or related to spotting. No itching. No pelvic pain. No back pain. No urinary symptoms. No fevers. Sexually active with 1 partner. Recently did not use condoms which she feel may have attributed to her symptoms, states she has had BV in the past after similar. Denies concern for potential STD exposure. Denies sores/lesions/ open areas. Has not taken any medications for her symptoms.    ROS per HPI.      Past Medical History:  Diagnosis Date  . Allergy    Seasonal    Patient Active Problem List   Diagnosis Date Noted  . Recurrent boils 04/02/2011  . 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 Morbid obesity (HCC) 04/02/2011  . Tobacco user 04/02/2011    Past Surgical History:  Procedure Laterality Date  . CHOLECYSTECTOMY    . PILONIDAL CYST EXCISION      OB History   None      Home Medications    Prior to Admission medications   Medication Sig Start Date End Date Taking? Authorizing Provider  Multiple Vitamin (MULTIVITAMIN PO) Take by mouth. [employer] made women's mvi    Yes [provider]  phentermine (ADIPEX-P) 37.5 MG tablet Take 1 tablet (37.5 mg total) by mouth daily before breakfast. 11/24/17 12/24/17 Yes Danelle Berry, PA-C  varenicline (CHANTIX STARTING MONTH PAK) 0.5 MG X 11 & 1 MG X 42 tablet Take 0.5 mg tablet by mouth 1x daily for 3 days, then increase to 0.5 mg tablet 2x daily for 4 days, then increase to 1 mg tablet 2x daily. 10/01/17  Yes Danelle Berry, PA-C  medroxyPROGESTERone (DEPO-PROVERA) 150 MG/ML injection Inject 1 mL (150 mg  total) into the muscle every 3 (three) months. 10/01/17   Danelle Berry, PA-C  metroNIDAZOLE (METROGEL VAGINAL) 0.75 % vaginal gel Place 1 Applicatorful vaginally at bedtime for 5 days. 12/14/17 12/19/17  Georgetta Haber, NP  phentermine (ADIPEX-P) 37.5 MG tablet Take 1 tablet (37.5 mg total) by mouth daily before breakfast. 12/24/17 01/23/18  Danelle Berry, PA-C    Family History Family History  Problem Relation Age of Onset  . Heart disease Father   . Diabetes Father   . Thyroid disease Mother        Pituitary tumor  . Kidney disease Mother        renal tumor- benign  . Thyroid disease Sister   . Diabetes Paternal Aunt   . Diabetes Paternal Uncle   . Diabetes Maternal Grandmother     Social History Social History   Tobacco Use  . Smoking status: Current Every Day Smoker    Packs/day: 0.50    Years: 24.00    Pack years: 12.00  . Smokeless tobacco: Never Used  Substance Use Topics  . Alcohol use: Yes    Alcohol/week: 1.0 standard drinks    Types: 1 Glasses of wine per week    Comment: SOCIALLY (TWICE A MONTH)  . Drug use: No     Allergies   Penicillins   Review of Systems Review of Systems  Physical Exam Triage Vital Signs ED Triage Vitals  Enc Vitals Group     BP 12/14/17 1743 130/76     Pulse Rate 12/14/17 1743 86     Resp 12/14/17 1743 16     Temp 12/14/17 1743 98.6 F (37 C)     Temp Source 12/14/17 1743 Oral     SpO2 12/14/17 1743 100 %     Weight --      Height --      Head Circumference --      Peak Flow --      Pain Score 12/14/17 1739 0     Pain Loc --      Pain Edu? --      Excl. in GC? --    No data found.  Updated Vital Signs BP 130/76 (BP Location: Right Arm)   Pulse 86   Temp 98.6 F (37 C) (Oral)   Resp 16   LMP 11/23/2017   SpO2 100%   Visual Acuity Right Eye Distance:   Left Eye Distance:   Bilateral Distance:    Right Eye Near:   Left Eye Near:    Bilateral Near:     Physical Exam  Constitutional: She is oriented  to person, place, and time. She appears well-developed and well-nourished. No distress.  Cardiovascular: Normal rate, regular rhythm and normal heart sounds.  Pulmonary/Chest: Effort normal and breath sounds normal.  Abdominal: Soft. There is no tenderness. There is no rigidity, no rebound, no guarding and no CVA tenderness.  Genitourinary:  Genitourinary Comments: Denies sores, lesions, vaginal bleeding; no pelvic pain; gu exam deferred at this time, vaginal self swab collected.    Neurological: She is alert and oriented to person, place, and time.  Skin: Skin is warm and dry.     UC Treatments / Results  Labs (all labs ordered are listed, but only abnormal results are displayed) Labs Reviewed  CERVICOVAGINAL ANCILLARY ONLY    EKG None  Radiology No results found.  Procedures Procedures (including critical care time)  Medications Ordered in UC Medications - No data to display  Initial Impression / Assessment and Plan / UC Course  I have reviewed the triage vital signs and the nursing notes.  Pertinent labs & imaging results that were available during my care of the patient were reviewed by me and considered in my medical decision making (see chart for details).     Concern for bv with flagyl initiated. Vaginal cytology pending. Will notify of any positive findings and if any changes to treatment are needed.  Encouraged safe sex practices. If symptoms worsen or do not improve in the next week to return to be seen or to follow up with PCP.  Patient verbalized understanding and agreeable to plan.   Final Clinical Impressions(s) / UC Diagnoses   Final diagnoses:  Acute vaginitis     Discharge Instructions     We will start treatment for BV today.  Will notify you of any positive findings from your vaginal swab and if any changes to treatment are needed.   I would recommend not have intercourse for the next week.  If symptoms worsen or do not improve in the next week  to return to be seen or to follow up with your primary care provider or gynecologist.     ED Prescriptions    Medication Sig Dispense Auth. Provider   metroNIDAZOLE (METROGEL VAGINAL) 0.75 % vaginal gel Place 1 Applicatorful vaginally at bedtime for 5 days.  50 g Georgetta Haber, NP     Controlled Substance Prescriptions [place] Controlled Substance Registry consulted? Not Applicable   Georgetta Haber, NP 12/14/17 [place]

## 2017-12-14 NOTE — Discharge Instructions (Signed)
We will start treatment for BV today.  Will notify you of any positive findings from your vaginal swab and if any changes to treatment are needed.   I would recommend not have intercourse for the next week.  If symptoms worsen or do not improve in the next week to return to be seen or to follow up with your primary care provider or gynecologist.

## 2017-12-14 NOTE — ED Triage Notes (Addendum)
Onset Saturday of not feeling right and having a discharge.  Today patient arrives with complaints of BV.  Patient started depo shot earlier this summer.  Denies abdominal pain, denies back pain.

## 2017-12-15 LAB — CERVICOVAGINAL ANCILLARY ONLY
BACTERIAL VAGINITIS: POSITIVE — AB
CANDIDA VAGINITIS: POSITIVE — AB
CHLAMYDIA, DNA PROBE: NEGATIVE
Neisseria Gonorrhea: NEGATIVE
Trichomonas: NEGATIVE

## 2017-12-17 ENCOUNTER — Telehealth (HOSPITAL_COMMUNITY): Payer: Self-pay

## 2017-12-17 MED ORDER — FLUCONAZOLE 150 MG PO TABS: 150.0000 mg | ORAL_TABLET | Freq: Every day | ORAL | 0 refills | Status: AC

## 2017-12-17 NOTE — Telephone Encounter (Signed)
Bacterial Vaginosis test is positive.  Prescription for metronidazole was given at the urgent care visit. Pt contacted regarding results. Answered all questions. Verbalized understanding.  Pt contacted regarding test for candida (yeast) was positive.  Prescription for fluconazole 150mg po now, repeat dose in 3d if needed, #2 no refills, sent to the pharmacy of record.  Recheck or followup with PCP for further evaluation if symptoms are not improving.  Answered all questions. 

## 2017-12-24 ENCOUNTER — Telehealth: Payer: Self-pay | Admitting: *Deleted

## 2017-12-24 DIAGNOSIS — N611 Abscess of the breast and nipple: Secondary | ICD-10-CM

## 2017-12-24 NOTE — Telephone Encounter (Signed)
Received call from patient.   States that she has bee seen by PCP and the [facility] for nipple abscess on both R and L nipple. States that she has had aspiration on L breast from the [facility]. States that she is concerned area has returned and would like order for diagnostic mammogram.   Ok to place order or does patient need to bee seen prior to orders?

## 2017-12-24 NOTE — Telephone Encounter (Signed)
Call placed to patient and patient made aware.   Orders placed.  

## 2017-12-24 NOTE — Telephone Encounter (Signed)
Ok for order?  

## 2017-12-28 ENCOUNTER — Other Ambulatory Visit: Payer: Self-pay | Admitting: Family Medicine

## 2017-12-28 DIAGNOSIS — N611 Abscess of the breast and nipple: Secondary | ICD-10-CM

## 2018-01-04 ENCOUNTER — Ambulatory Visit: Payer: 59 | Admitting: Orthotics

## 2018-01-04 DIAGNOSIS — M722 Plantar fascial fibromatosis: Secondary | ICD-10-CM

## 2018-01-04 DIAGNOSIS — M76821 Posterior tibial tendinitis, right leg: Secondary | ICD-10-CM

## 2018-01-04 DIAGNOSIS — M779 Enthesopathy, unspecified: Secondary | ICD-10-CM

## 2018-01-04 NOTE — Progress Notes (Signed)
Patient came in today to pick up custom made foot orthotics.  The goals were accomplished and the patient reported no dissatisfaction with said orthotics.  Patient was advised of breakin period and how to report any issues.Patient came in today to pick up custom made foot orthotics.  The goals were accomplished and the patient reported no dissatisfaction with said orthotics.  Patient was advised of breakin period and how to report any issues. 

## 2018-01-05 ENCOUNTER — Other Ambulatory Visit: Payer: Self-pay | Admitting: Family Medicine

## 2018-01-05 ENCOUNTER — Ambulatory Visit
Admission: RE | Admit: 2018-01-05 | Discharge: 2018-01-05 | Disposition: A | Discharge status: Home / Self Care | Payer: 59 | Source: Ambulatory Visit | Attending: Family Medicine | Admitting: Family Medicine

## 2018-01-05 ENCOUNTER — Other Ambulatory Visit: Payer: 59

## 2018-01-05 DIAGNOSIS — N611 Abscess of the breast and nipple: Secondary | ICD-10-CM

## 2018-01-05 DIAGNOSIS — N61 Mastitis without abscess: Secondary | ICD-10-CM | POA: Diagnosis not present

## 2018-01-05 DIAGNOSIS — N6341 Unspecified lump in right breast, subareolar: Secondary | ICD-10-CM | POA: Diagnosis not present

## 2018-01-05 DIAGNOSIS — N6310 Unspecified lump in the right breast, unspecified quadrant: Secondary | ICD-10-CM

## 2018-01-05 DIAGNOSIS — N6031 Fibrosclerosis of right breast: Secondary | ICD-10-CM | POA: Diagnosis not present

## 2018-01-05 DIAGNOSIS — N6001 Solitary cyst of right breast: Secondary | ICD-10-CM | POA: Diagnosis not present

## 2018-01-14 LAB — AEROBIC/ANAEROBIC CULTURE W GRAM STAIN (SURGICAL/DEEP WOUND): Special Requests: NORMAL

## 2018-01-14 LAB — AEROBIC/ANAEROBIC CULTURE (SURGICAL/DEEP WOUND)

## 2018-01-19 ENCOUNTER — Other Ambulatory Visit: Payer: 59

## 2018-01-19 ENCOUNTER — Inpatient Hospital Stay: Admission: RE | Admit: 2018-01-19 | Payer: 59 | Source: Ambulatory Visit

## 2018-01-26 ENCOUNTER — Ambulatory Visit: Payer: 59 | Admitting: Family Medicine

## 2018-01-26 ENCOUNTER — Encounter: Payer: Self-pay | Admitting: Family Medicine

## 2018-01-26 VITALS — BP 120/82 | HR 71 | Temp 98.4°F | Ht 68.0 in | Wt 258.0 lb

## 2018-01-26 DIAGNOSIS — Z309 Encounter for contraceptive management, unspecified: Secondary | ICD-10-CM | POA: Diagnosis not present

## 2018-01-26 DIAGNOSIS — Z6839 Body mass index (BMI) 39.0-39.9, adult: Secondary | ICD-10-CM

## 2018-01-26 DIAGNOSIS — Z23 Encounter for immunization: Secondary | ICD-10-CM | POA: Diagnosis not present

## 2018-01-26 DIAGNOSIS — E669 Obesity, unspecified: Secondary | ICD-10-CM | POA: Diagnosis not present

## 2018-01-26 LAB — PREGNANCY, URINE: Preg Test, Ur: NEGATIVE

## 2018-01-26 MED ORDER — MEDROXYPROGESTERONE ACETATE 150 MG/ML IM SUSP
150.0000 mg | Freq: Once | INTRAMUSCULAR | Status: AC
  Administered 2018-01-26: 150 mg via INTRAMUSCULAR

## 2018-01-26 NOTE — Progress Notes (Signed)
Patient ID: Lorraine [name], female    DOB: 18-May-1970, 47 y.o.   MRN: [id number]  PCP: Salley Scarlet, MD  Chief Complaint  Patient presents with  . Weight Loss    Subjective:   Lorraine [name] is a 47 y.o. female, presents to clinic with CC of follow up for weightloss with phentermine.  She is also late to get her depo shot and would like to do that today.    Eating good, avoiding pork and red meats, grilled food and salads, is eating about half of the amount that she used to.  She is losing weight, the only side effect she notices for the medication is decreased appetite and dry mouth.  She does drink several Pepsi's a day still and has difficulty making herself drink low-calorie fluids denies any rapid heart rate, chest pain, decreased urine output or near syncope.   Wt Readings from Last 5 Encounters:  01/26/18 258 lb (117 kg)  11/24/17 261 lb (118.4 kg)  10/01/17 277 lb (125.6 kg)  01/13/17 284 lb (128.8 kg)  12/22/16 283 lb (128.4 kg)     Patient Active Problem List   Diagnosis Date Noted  . Recurrent boils 04/02/2011  . 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 Morbid obesity (HCC) 04/02/2011  . Tobacco user 04/02/2011    Current Meds  Medication Sig  . medroxyPROGESTERone (DEPO-PROVERA) 150 MG/ML injection Inject 1 mL (150 mg total) into the muscle every 3 (three) months.  . Multiple Vitamin (MULTIVITAMIN PO) Take by mouth. Natures made women's mvi      Review of Systems  Constitutional: Negative.   HENT: Negative.   Eyes: Negative.   Respiratory: Negative.   Cardiovascular: Negative.   Gastrointestinal: Negative.   Endocrine: Negative.   Genitourinary: Negative.   Musculoskeletal: Negative.   Skin: Negative.   Allergic/Immunologic: Negative.   Neurological: Negative.   Hematological: Negative.   Psychiatric/Behavioral: Negative.   All other systems reviewed and are negative.      Objective:    Vitals:   01/26/18 1537  Pulse: 71  Temp: 98.4 F  (36.9 C)  TempSrc: Oral  SpO2: 99%  Weight: 258 lb (117 kg)      Physical Exam  Constitutional: She appears well-developed and well-nourished. No distress.  HENT:  Head: Normocephalic and atraumatic.  Nose: Nose normal.  Mouth/Throat: Oropharynx is clear and moist.  Eyes: Conjunctivae are normal. Right eye exhibits no discharge. Left eye exhibits no discharge.  Neck: No tracheal deviation present.  Cardiovascular: Normal rate, regular rhythm, normal heart sounds and intact distal pulses. Exam reveals no gallop and no friction rub.  No murmur heard. Pulmonary/Chest: Effort normal. No stridor. No respiratory distress.  Musculoskeletal: Normal range of motion.  Neurological: She is alert. She exhibits normal muscle tone. Coordination normal.  Skin: Skin is warm and dry. No rash noted. She is not diaphoretic.  Psychiatric: She has a normal mood and affect. Her behavior is normal.  Nursing note and vitals reviewed.         Assessment & Plan:   Patient here for follow-up with weight loss and medication management with phentermine, she is tolerating medications, is losing some weight.  She is not eating very much throughout the day and having smaller portions when she does eat but she does continue to drink a lot of Pepsi.  She was encouraged to add small protein rich snacks throughout the day, try to eat breakfast, and has to cut back on Pepsi and increase water  or other low calorie drinks throughout the day. Patient only has 1 more month of phentermine, without establishing good dietary habits and increased low-calorie fluids she will regain her weight and we discussed this at length.  I encouraged her to go to the nutritional for which I entered and she reluctantly agrees.   Problem List Items Addressed This Visit    None    Visit Diagnoses    Encounter for contraceptive management, unspecified type    -  Primary   Relevant Medications   medroxyPROGESTERone (DEPO-PROVERA)  injection 150 mg (Completed)   Other Relevant Orders   Pregnancy, urine (Completed)   Class 2 obesity with body mass index (BMI) of 39.0 to 39.9 in adult, unspecified obesity type, unspecified whether serious comorbidity present       Relevant Orders   Amb ref to Medical Nutrition Therapy-MNT   Need for influenza vaccination       Relevant Orders   Flu Vaccine QUAD 6+ mos PF IM (Fluarix Quad PF) (Completed)      Urine pregnancy test was negative and patient was given her Depo-Provera dose    Danelle BerryLeisa Lelan Cush, PA-C 01/26/18 3:49 PM

## 2018-01-28 ENCOUNTER — Ambulatory Visit
Admission: RE | Admit: 2018-01-28 | Discharge: 2018-01-28 | Disposition: A | Discharge status: Home / Self Care | Payer: 59 | Source: Ambulatory Visit | Attending: Family Medicine | Admitting: Family Medicine

## 2018-01-28 DIAGNOSIS — N611 Abscess of the breast and nipple: Secondary | ICD-10-CM | POA: Diagnosis not present

## 2018-04-19 ENCOUNTER — Ambulatory Visit (INDEPENDENT_AMBULATORY_CARE_PROVIDER_SITE_OTHER): Payer: 59

## 2018-04-19 DIAGNOSIS — Z309 Encounter for contraceptive management, unspecified: Secondary | ICD-10-CM | POA: Diagnosis not present

## 2018-04-19 MED ORDER — MEDROXYPROGESTERONE ACETATE 150 MG/ML IM SUSP
150.0000 mg | Freq: Once | INTRAMUSCULAR | Status: AC
  Administered 2018-04-19: 150 mg via INTRAMUSCULAR

## 2018-06-21 ENCOUNTER — Other Ambulatory Visit: Payer: Self-pay | Admitting: Family Medicine

## 2018-06-21 DIAGNOSIS — Z30019 Encounter for initial prescription of contraceptives, unspecified: Secondary | ICD-10-CM

## 2018-07-07 ENCOUNTER — Encounter: Payer: Self-pay | Admitting: Family Medicine

## 2018-07-07 ENCOUNTER — Other Ambulatory Visit: Payer: Self-pay

## 2018-07-07 ENCOUNTER — Ambulatory Visit: Payer: 59 | Admitting: Family Medicine

## 2018-07-07 ENCOUNTER — Ambulatory Visit: Payer: 59

## 2018-07-07 VITALS — BP 110/68 | HR 90 | Temp 98.4°F | Resp 18 | Ht 68.0 in | Wt 258.0 lb

## 2018-07-07 DIAGNOSIS — E669 Obesity, unspecified: Secondary | ICD-10-CM

## 2018-07-07 DIAGNOSIS — Z6839 Body mass index (BMI) 39.0-39.9, adult: Secondary | ICD-10-CM

## 2018-07-07 DIAGNOSIS — Z309 Encounter for contraceptive management, unspecified: Secondary | ICD-10-CM

## 2018-07-07 MED ORDER — PHENTERMINE HCL 37.5 MG PO TABS: 37.5000 mg | ORAL_TABLET | Freq: Every day | ORAL | 1 refills | Status: DC

## 2018-07-07 MED ORDER — MEDROXYPROGESTERONE ACETATE 150 MG/ML IM SUSP
150.0000 mg | Freq: Once | INTRAMUSCULAR | Status: AC
  Administered 2018-07-07: 150 mg via INTRAMUSCULAR

## 2018-07-07 NOTE — Progress Notes (Signed)
Depo injection given. Pt tolerated well.

## 2018-07-07 NOTE — Progress Notes (Signed)
Patient ID: Lorraine [name], female    DOB: 06-23-1970, 48 y.o.   MRN: [id number]  PCP: Salley Scarlet, MD  Chief Complaint  Patient presents with  . Shoulder Pain    no injury, started 04/26, no meds taken  . Medication Refill  . Injections    depo    Subjective:   Lorraine [name] is a 48 y.o. female, presents to clinic with CC of obesity, wants refill on phentermine, and is also here for depo provera shot with nurse.  She has been trying to cut out soda, increase water, add vegetables and cut carbs in diet.  She got a treadmill but has not yet been increasing her activity very much.  With taking phentermine in the past she denies any CP, palpitations, dry mouth, or any other SE.  Since last taking phentermine she says she got down to 240's but is back up to 258.  Overall has lost weight over the past 2 years.  Still has trouble drinking pepsi's.   Wt Readings from Last 5 Encounters:  07/07/18 258 lb (117 kg)  01/26/18 258 lb (117 kg)  11/24/17 261 lb (118.4 kg)  10/01/17 277 lb (125.6 kg)  01/13/17 284 lb (128.8 kg)   Pt is ontime for depo shot.  No concerns with continued use  Patient Active Problem List   Diagnosis Date Noted  . Recurrent boils 04/02/2011  . 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 Morbid obesity (HCC) 04/02/2011  . Tobacco user 04/02/2011     Prior to Admission medications   Medication Sig Start Date End Date Taking? Authorizing Provider  medroxyPROGESTERone Acetate 150 MG/ML SUSY INJECT 1 ML INTO THE MUSCLE EVERY 3 MONTHS 06/21/18  Yes Oxford, Velna Hatchet, MD  Multiple Vitamin (MULTIVITAMIN PO) Take by mouth. Natures made women's mvi    Yes [provider]  phentermine (ADIPEX-P) 37.5 MG tablet Take 1 tablet (37.5 mg total) by mouth daily before breakfast. 11/24/17 07/07/18 Yes Danelle Berry, PA-C     Allergies  Allergen Reactions  . Penicillins      Family History  Problem Relation Age of Onset  . Heart disease Father   . Diabetes Father    . Thyroid disease Mother        Pituitary tumor  . Kidney disease Mother        renal tumor- benign  . Thyroid disease Sister   . Diabetes Paternal Aunt   . Diabetes Paternal Uncle   . Diabetes Maternal Grandmother      Social History   Socioeconomic History  . Marital status: Significant Other    Spouse name: Not on file  . Number of children: Not on file  . Years of education: Not on file  . Highest education level: Not on file  Occupational History  . Not on file  Social Needs  . Financial resource strain: Not on file  . Food insecurity:    Worry: Not on file    Inability: Not on file  . Transportation needs:    Medical: Not on file    Non-medical: Not on file  Tobacco Use  . Smoking status: Current Every Day Smoker    Packs/day: 0.50    Years: 24.00    Pack years: 12.00  . Smokeless tobacco: Never Used  Substance and Sexual Activity  . Alcohol use: Yes    Alcohol/week: 1.0 standard drinks    Types: 1 Glasses of wine per week    Comment: SOCIALLY (TWICE A  MONTH)  . Drug use: No  . Sexual activity: Yes    Birth control/protection: Condom, Injection  Lifestyle  . Physical activity:    Days per week: 2 days    Minutes per session: 30 min  . Stress: Not on file  Relationships  . Social connections:    Talks on phone: Not on file    Gets together: Not on file    Attends religious service: Not on file    Active member of club or organization: Not on file    Attends meetings of clubs or organizations: Not on file    Relationship status: Not on file  . Intimate partner violence:    Fear of current or ex partner: Not on file    Emotionally abused: Not on file    Physically abused: Not on file    Forced sexual activity: Not on file  Other Topics Concern  . Not on file  Social History Narrative  . Not on file     Review of Systems  Constitutional: Negative.   HENT: Negative.   Eyes: Negative.   Respiratory: Negative.   Cardiovascular: Negative.    Gastrointestinal: Negative.   Endocrine: Negative.   Genitourinary: Negative.   Musculoskeletal: Negative.   Skin: Negative.   Allergic/Immunologic: Negative.   Neurological: Negative.   Hematological: Negative.   Psychiatric/Behavioral: Negative.   All other systems reviewed and are negative.      Objective:    Vitals:   07/07/18 0919  BP: 110/68  Pulse: 90  Resp: 18  Temp: 98.4 F (36.9 C)  SpO2: 98%  Weight: 258 lb (117 kg)  Height: 5\' 8"  (1.727 m)      Physical Exam Vitals signs and nursing note reviewed.  Constitutional:      General: She is not in acute distress.    Appearance: Normal appearance. She is well-developed. She is obese. She is not ill-appearing, toxic-appearing or diaphoretic.  HENT:     Head: Normocephalic and atraumatic.     Nose: Nose normal.  Eyes:     General:        Right eye: No discharge.        Left eye: No discharge.     Conjunctiva/sclera: Conjunctivae normal.  Neck:     Trachea: No tracheal deviation.  Cardiovascular:     Rate and Rhythm: Normal rate and regular rhythm.     Pulses: Normal pulses.     Heart sounds: Normal heart sounds. No murmur. No friction rub. No gallop.   Pulmonary:     Effort: Pulmonary effort is normal. No respiratory distress.     Breath sounds: Normal breath sounds. No stridor.  Musculoskeletal: Normal range of motion.     Right lower leg: No edema.     Left lower leg: No edema.  Skin:    General: Skin is warm and dry.     Findings: No rash.  Neurological:     Mental Status: She is alert.     Motor: No abnormal muscle tone.     Coordination: Coordination normal.  Psychiatric:        Behavior: Behavior normal.           Assessment & Plan:      ICD-10-CM   1. Class 2 obesity with body mass index (BMI) of 39.0 to 39.9 in adult, unspecified obesity type, unspecified whether serious comorbidity present E66.9 phentermine (ADIPEX-P) 37.5 MG tablet   Z68.39    has used phentermine several times  in the past year, overall only 3 months, not consecutive, will refill.  Discussion on diet and exercise, decreasing calories, d/c soda gradually, increase water intake.  Count calories and work on overall net negative, which will be difficult to achieve if she cannot cut out pepsi. Pt to follow up with PCP  2. Encounter for contraceptive management, unspecified type Z30.9 medroxyPROGESTERone (DEPO-PROVERA) injection 150 mg       Danelle BerryLeisa Avree Szczygiel, PA-C 07/07/18 9:34 AM

## 2018-07-20 ENCOUNTER — Encounter: Payer: Self-pay | Admitting: Family Medicine

## 2018-07-20 ENCOUNTER — Other Ambulatory Visit: Payer: Self-pay

## 2018-07-20 ENCOUNTER — Ambulatory Visit: Payer: 59 | Admitting: Family Medicine

## 2018-07-20 VITALS — BP 124/68 | HR 94 | Temp 99.0°F | Resp 16 | Ht 68.0 in | Wt 256.0 lb

## 2018-07-20 DIAGNOSIS — N760 Acute vaginitis: Secondary | ICD-10-CM

## 2018-07-20 DIAGNOSIS — B9689 Other specified bacterial agents as the cause of diseases classified elsewhere: Secondary | ICD-10-CM | POA: Diagnosis not present

## 2018-07-20 DIAGNOSIS — N898 Other specified noninflammatory disorders of vagina: Secondary | ICD-10-CM | POA: Diagnosis not present

## 2018-07-20 DIAGNOSIS — Z720 Tobacco use: Secondary | ICD-10-CM

## 2018-07-20 DIAGNOSIS — N6001 Solitary cyst of right breast: Secondary | ICD-10-CM

## 2018-07-20 LAB — WET PREP FOR TRICH, YEAST, CLUE

## 2018-07-20 MED ORDER — FLUCONAZOLE 150 MG PO TABS: 150.0000 mg | ORAL_TABLET | Freq: Once | ORAL | 0 refills | Status: AC

## 2018-07-20 MED ORDER — CLINDAMYCIN HCL 300 MG PO CAPS: 300.0000 mg | ORAL_CAPSULE | Freq: Three times a day (TID) | ORAL | 0 refills | Status: DC

## 2018-07-20 NOTE — Progress Notes (Signed)
   Subjective:    Patient ID: Lorraine [name], female    DOB: [birth year], 48 y.o.   MRN: [id number]  Patient presents for Vvaginal Odor (x2 days)  Pt here with vaginal discharge odor for past 2 days, no new partner Unknown LMP due to Depo Provera  Denies any abdominal pain denies any UTI symptoms.  No fever no abnormal bleeding.  Has noted a fishy odor.  Has history of recurrent cysts in both breasts.  She has had mammograms as well as aspirations done last in October 2019 on the right breast.  She has another one that has come back in the same spot though she has had them in various areas.  She denies any pain or tenderness it is not grown in size but it is hard.  She has not used any warm compresses.  She has been trying to decrease her smoking which she knows contributes she also drinks a good amount caffeine and she tried to decrease this along with her weight loss journey.    Review Of Systems:  GEN- denies fatigue, fever, weight loss,weakness, recent illness HEENT- denies eye drainage, change in vision, nasal discharge, CVS- denies chest pain, palpitations RESP- denies SOB, cough, wheeze ABD- denies N/V, change in stools, abd pain GU- denies dysuria, hematuria, dribbling, incontinence MSK- denies joint pain, muscle aches, injury Neuro- denies headache, dizziness, syncope, seizure activity       Objective:    BP 124/68   Pulse 94   Temp 99 F (37.2 C) (Oral)   Resp 16   Ht 5\' 8"  (1.727 m)   Wt 256 lb (116.1 kg)   SpO2 93%   BMI 38.92 kg/m  GEN- NAD, alert and oriented x3 CVS- RRR, no murmur RESP-CTAB Breast- normal symmetry, no nipple inversion,no nipple drainage,cystic nodule Right breast 5 oclock position, no erythema, mild TTP Nodes- no axillary nodes GU- normal external genitalia, vaginal mucosa pink and moist, cervix visualized no growth, no blood form os, + white discharge, no CMT, no ovarian masses, uterus normal size ABD-NABS,soft,NT,ND, no CVA tenderness  EXT-  No edema Pulses- Radial  2+        Assessment & Plan:      Problem List Items Addressed This Visit      Unprioritized   Tobacco user - she is cutting back on tobacco use    Other Visit Diagnoses    BV (bacterial vaginosis)    -  Primary   Treat with clindamycin, given difluacn in case of yeast after use, clinda to treat BV and see if this helps breast cyst   Relevant Medications   clindamycin (CLEOCIN) 300 MG capsule   fluconazole (DIFLUCAN) 150 MG tablet   Other Relevant Orders   WET PREP FOR TRICH, YEAST, CLUE (Completed)   C. trachomatis/N. gonorrhoeae RNA   Benign cyst of right breast       She has had few actinomycyes on culture before, but no other bacteria grew out, see if clindamycin will help this as well. Use warm compress. for recurrent cyst in the breast, will send to general surgery for consultation, She has had them aspirated but continue to return    Relevant Orders   Ambulatory referral to General Surgery      Note: This dictation was prepared with Dragon dictation along with smaller phrase technology. Any transcriptional errors that result from this process are unintentional.

## 2018-07-20 NOTE — Patient Instructions (Addendum)
Take antibiotics as prescribed Referral to breast surgeon for consultation F/U Change Depo Appt to OV to f/u Meds as well

## 2018-07-21 LAB — C. TRACHOMATIS/N. GONORRHOEAE RNA
C. trachomatis RNA, TMA: NOT DETECTED
N. gonorrhoeae RNA, TMA: NOT DETECTED

## 2018-08-09 ENCOUNTER — Other Ambulatory Visit: Payer: Self-pay | Admitting: Surgery

## 2018-08-12 ENCOUNTER — Other Ambulatory Visit: Payer: Self-pay | Admitting: Surgery

## 2018-08-16 ENCOUNTER — Other Ambulatory Visit (HOSPITAL_COMMUNITY): Payer: Self-pay

## 2018-08-16 ENCOUNTER — Other Ambulatory Visit (HOSPITAL_COMMUNITY)
Admission: RE | Admit: 2018-08-16 | Discharge: 2018-08-16 | Disposition: A | Discharge status: Home / Self Care | Payer: 59 | Source: Ambulatory Visit | Attending: Surgery | Admitting: Surgery

## 2018-08-16 DIAGNOSIS — Z1159 Encounter for screening for other viral diseases: Secondary | ICD-10-CM | POA: Insufficient documentation

## 2018-08-18 ENCOUNTER — Encounter (HOSPITAL_COMMUNITY): Payer: Self-pay | Admitting: *Deleted

## 2018-08-18 ENCOUNTER — Other Ambulatory Visit: Payer: Self-pay

## 2018-08-18 LAB — NOVEL CORONAVIRUS, NAA (HOSP ORDER, SEND-OUT TO REF LAB; TAT 18-24 HRS): SARS-CoV-2, NAA: NOT DETECTED

## 2018-08-18 NOTE — H&P (Signed)
Lorraine [name] Documented: 08/09/2018 11:02 AM Location: [facility] Patient #: [id number] DOB: 08/21/1970 Single / Language: Cleophus Molt / Race: Black or African American Female   History of Present Illness (Jeily Guthridge A. Ninfa Linden MD; 08/09/2018 11:18 AM) The patient is a 48 year old female who presents with a breast abscess. This is a 48 year old female referred by Dr. Vic Blackbird for recurrent right breast abscess. She has a history of having had infected breast cysts in both breasts in the past. She has had multiple aspirations in both breasts. Most recently, she's had a recurrence of a painful abscess in the right breast. She has been on some clindamycin and this has improved her discomfort. She was to have this area formally excised in the operating room. She has a history of bilateral inverted nipples. There is no family history of breast cancer. Her mammograms have otherwise been unremarkable.   Allergies Malachi Bonds, CMA; 08/09/2018 11:02 AM) Penicillins   Medication History (Chemira Jones, CMA; 08/09/2018 11:03 AM) Phentermine HCl (37.5MG  Tablet, Oral) Active. Medications Reconciled  Vitals (Chemira Jones CMA; 08/09/2018 11:02 AM) 08/09/2018 11:02 AM Weight: 253.6 lb Height: 68in Body Surface Area: 2.26 m Body Mass Index: 38.56 kg/m  Temp.: 97.46F(Oral)  BP: 136/80 (Sitting, Left Arm, Standard)       Physical Exam (Zahava Quant A. Ninfa Linden MD; 08/09/2018 11:19 AM) General Mental Status-Alert. General Appearance-Consistent with stated age. Hydration-Well hydrated. Voice-Normal.  Head and Neck Head-normocephalic, atraumatic with no lesions or palpable masses. Trachea-midline. Thyroid Gland Characteristics - normal size and consistency.  Eye Eyeball - Bilateral-Extraocular movements intact. Sclera/Conjunctiva - Bilateral-No scleral icterus.  Chest and Lung Exam Chest and lung exam reveals -quiet, even and easy respiratory  effort with no use of accessory muscles and on auscultation, normal breath sounds, no adventitious sounds and normal vocal resonance. Inspection Chest Wall - Normal. Back - normal.  Breast Nipples Characteristics - Left - Note: Inverted nipple. Characteristics - Right - Note: Inverted nipple. Breast - Left-Symmetric, Non Tender, No Biopsy scars, no Dimpling, No Inflammation, No Lumpectomy scars, No Mastectomy scars, No Peau d' Orange. Breast - Right-Symmetric, Tender and Inflamed, No Biopsy scars, No Dimpling, No Lumpectomy scars, No Mastectomy scars, No Peau d' Orange. Note: There is approximate 4 cm abscess which is deep in the right breast at 3 o'clock position underneath the areola. At the patient's request, I prepped irrigated Betadine, anesthetized with lidocaine, and inserted an 18-gauge needle draining 60 cc of purulent fluid.  Cardiovascular Cardiovascular examination reveals -normal heart sounds, regular rate and rhythm with no murmurs and normal pedal pulses bilaterally.  Abdomen Inspection Inspection of the abdomen reveals - No Hernias. Skin - Scar - no surgical scars. Palpation/Percussion Palpation and Percussion of the abdomen reveal - Soft, Non Tender, No Rebound tenderness, No Rigidity (guarding) and No hepatosplenomegaly. Auscultation Auscultation of the abdomen reveals - Bowel sounds normal.  Neurologic - Did not examine.  Musculoskeletal - Did not examine.  Lymphatic Head & Neck  General Head & Neck Lymphatics: Bilateral - Description - Normal. Axillary  General Axillary Region: Bilateral - Description - Normal. Tenderness - Non Tender. Femoral & Inguinal - Did not examine.    Assessment & Plan (Zaiya Annunziato A. Ninfa Linden MD; 08/09/2018 11:20 AM) ABSCESS OF BREAST, RIGHT (N61.1) Impression: She was to go ahead and proceed with surgical excision of the chronic breast abscess which I feel is reasonable this point. 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 I discussed the surgical procedure in detail. We  discussed the possibility of having an open wound and wound packing at the time of surgery. Hopefully I will be able to close the incision. I will also excise tissue and sent to pathology for histologic evaluation trauma malignancy. We will also try to evert the nipple. We discussed the other risks of surgery which includes but is not limited to bleeding, infection, recurrence, etc. We discussed preoperative COVID testing as well. I will go ahead and continue her clindamycin preoperatively. Surgery will be scheduled as soon as possible Current Plans Started Clindamycin HCl 300 MG Oral Capsule, 1 (one) Capsule three times daily, #21, 08/09/2018, No Refill. Started traMADol HCl 50 MG Oral Tablet, 1 (one) Tablet every six hours, as needed, #40, 08/09/2018, No Refill.

## 2018-08-18 NOTE — Progress Notes (Addendum)
Patient informed of the [place] that is currently in effect.  Patient verbalized understanding.  Patient denies shortness of breath, fever, cough and chest pain.  PCP - Dr Buelah Manis at [facility] Cardiologist - Denies  Chest x-ray - Denies EKG - Denies Stress Test - Denies ECHO - Denies Cardiac Cath - Denies  ERAS: Clears til 4:30 am Friday, No Drink  STOP now taking any Aspirin (unless otherwise instructed by your surgeon), Aleve, Naproxen, Ibuprofen, Motrin, Advil, Goody's, BC's, all herbal medications, fish oil, and all vitamins, phentermine.

## 2018-08-18 NOTE — Progress Notes (Signed)
  Coronavirus Screening Have you experienced the following symptoms:  Cough yes/no: No Fever (>100.72F)  yes/no: No Runny nose yes/no: No Sore throat yes/no: No Difficulty breathing/shortness of breath  yes/no: No  Have you or your boyfriend member traveled in the last 14 days and where? yes/no: No

## 2018-08-19 ENCOUNTER — Ambulatory Visit (HOSPITAL_COMMUNITY)
Admission: RE | Admit: 2018-08-19 | Discharge: 2018-08-19 | Disposition: A | Discharge status: Home / Self Care | Payer: 59 | Attending: Surgery | Admitting: Surgery

## 2018-08-19 ENCOUNTER — Other Ambulatory Visit: Payer: Self-pay

## 2018-08-19 ENCOUNTER — Ambulatory Visit: Admit: 2018-08-19 | Payer: Self-pay | Admitting: Surgery

## 2018-08-19 ENCOUNTER — Encounter (HOSPITAL_COMMUNITY)
Admission: RE | Disposition: A | Discharge status: Home / Self Care | Payer: Self-pay | Source: Home / Self Care | Attending: Surgery

## 2018-08-19 ENCOUNTER — Ambulatory Visit (HOSPITAL_COMMUNITY): Payer: 59 | Admitting: Anesthesiology

## 2018-08-19 DIAGNOSIS — Z79899 Other long term (current) drug therapy: Secondary | ICD-10-CM | POA: Insufficient documentation

## 2018-08-19 DIAGNOSIS — N611 Abscess of the breast and nipple: Secondary | ICD-10-CM | POA: Insufficient documentation

## 2018-08-19 DIAGNOSIS — E669 Obesity, unspecified: Secondary | ICD-10-CM | POA: Insufficient documentation

## 2018-08-19 DIAGNOSIS — F17200 Nicotine dependence, unspecified, uncomplicated: Secondary | ICD-10-CM | POA: Insufficient documentation

## 2018-08-19 DIAGNOSIS — Z6837 Body mass index (BMI) 37.0-37.9, adult: Secondary | ICD-10-CM | POA: Diagnosis not present

## 2018-08-19 DIAGNOSIS — Z880 Allergy status to penicillin: Secondary | ICD-10-CM | POA: Insufficient documentation

## 2018-08-19 HISTORY — PX: MASS EXCISION: SHX2000

## 2018-08-19 HISTORY — DX: Other seasonal allergic rhinitis: J30.2

## 2018-08-19 LAB — CBC
HCT: 41.3 % (ref 36.0–46.0)
Hemoglobin: 13.6 g/dL (ref 12.0–15.0)
MCH: 29.5 pg (ref 26.0–34.0)
MCHC: 32.9 g/dL (ref 30.0–36.0)
MCV: 89.6 fL (ref 80.0–100.0)
Platelets: 348 10*3/uL (ref 150–400)
RBC: 4.61 MIL/uL (ref 3.87–5.11)
RDW: 11.6 % (ref 11.5–15.5)
WBC: 5.5 10*3/uL (ref 4.0–10.5)
nRBC: 0 % (ref 0.0–0.2)

## 2018-08-19 LAB — POCT PREGNANCY, URINE: Preg Test, Ur: NEGATIVE

## 2018-08-19 SURGERY — EXCISION MASS
Anesthesia: General | Site: Breast | Laterality: Right

## 2018-08-19 SURGERY — EXCISION OF BREAST LESION
Anesthesia: General | Laterality: Right

## 2018-08-19 MED ORDER — MIDAZOLAM HCL 5 MG/5ML IJ SOLN
INTRAMUSCULAR | Status: DC | PRN
  Administered 2018-08-19: 2 mg via INTRAVENOUS

## 2018-08-19 MED ORDER — OXYCODONE HCL 5 MG/5ML PO SOLN: 5.0000 mg | Freq: Once | ORAL | Status: DC | PRN

## 2018-08-19 MED ORDER — CIPROFLOXACIN IN D5W 400 MG/200ML IV SOLN
INTRAVENOUS | Status: AC
  Filled 2018-08-19: qty 200

## 2018-08-19 MED ORDER — LACTATED RINGERS IV SOLN
INTRAVENOUS | Status: DC | PRN
  Administered 2018-08-19: 08:00:00 via INTRAVENOUS

## 2018-08-19 MED ORDER — ACETAMINOPHEN 10 MG/ML IV SOLN: 1000.0000 mg | Freq: Once | INTRAVENOUS | Status: DC | PRN

## 2018-08-19 MED ORDER — LIDOCAINE HCL (PF) 1 % IJ SOLN
INTRAMUSCULAR | Status: AC
  Filled 2018-08-19: qty 30

## 2018-08-19 MED ORDER — DOXYCYCLINE HYCLATE 100 MG PO TABS: 100.0000 mg | ORAL_TABLET | Freq: Two times a day (BID) | ORAL | 1 refills | Status: DC

## 2018-08-19 MED ORDER — FENTANYL CITRATE (PF) 100 MCG/2ML IJ SOLN
INTRAMUSCULAR | Status: DC | PRN
  Administered 2018-08-19: 100 ug via INTRAVENOUS
  Administered 2018-08-19 (×4): 25 ug via INTRAVENOUS

## 2018-08-19 MED ORDER — PROPOFOL 10 MG/ML IV BOLUS
INTRAVENOUS | Status: DC | PRN
  Administered 2018-08-19: 180 mg via INTRAVENOUS
  Administered 2018-08-19: 20 mg via INTRAVENOUS

## 2018-08-19 MED ORDER — FENTANYL CITRATE (PF) 100 MCG/2ML IJ SOLN: 25.0000 ug | INTRAMUSCULAR | Status: DC | PRN

## 2018-08-19 MED ORDER — CELECOXIB 200 MG PO CAPS
ORAL_CAPSULE | ORAL | Status: AC
  Administered 2018-08-19: 08:00:00 200 mg via ORAL
  Filled 2018-08-19: qty 1

## 2018-08-19 MED ORDER — LIDOCAINE 2% (20 MG/ML) 5 ML SYRINGE
INTRAMUSCULAR | Status: DC | PRN
  Administered 2018-08-19: 100 mg via INTRAVENOUS

## 2018-08-19 MED ORDER — ACETAMINOPHEN 500 MG PO TABS
1000.0000 mg | ORAL_TABLET | ORAL | Status: AC
  Administered 2018-08-19: 1000 mg via ORAL

## 2018-08-19 MED ORDER — 0.9 % SODIUM CHLORIDE (POUR BTL) OPTIME
TOPICAL | Status: DC | PRN
  Administered 2018-08-19: 1000 mL

## 2018-08-19 MED ORDER — DEXAMETHASONE SODIUM PHOSPHATE 10 MG/ML IJ SOLN
INTRAMUSCULAR | Status: AC
  Filled 2018-08-19: qty 1

## 2018-08-19 MED ORDER — BUPIVACAINE-EPINEPHRINE 0.25% -1:200000 IJ SOLN
INTRAMUSCULAR | Status: DC | PRN
  Administered 2018-08-19: 20 mL

## 2018-08-19 MED ORDER — PROMETHAZINE HCL 25 MG/ML IJ SOLN: 6.2500 mg | INTRAMUSCULAR | Status: DC | PRN

## 2018-08-19 MED ORDER — MIDAZOLAM HCL 2 MG/2ML IJ SOLN
INTRAMUSCULAR | Status: AC
  Filled 2018-08-19: qty 2

## 2018-08-19 MED ORDER — GABAPENTIN 300 MG PO CAPS
ORAL_CAPSULE | ORAL | Status: AC
  Administered 2018-08-19: 300 mg via ORAL
  Filled 2018-08-19: qty 1

## 2018-08-19 MED ORDER — LIDOCAINE 2% (20 MG/ML) 5 ML SYRINGE
INTRAMUSCULAR | Status: AC
  Filled 2018-08-19: qty 5

## 2018-08-19 MED ORDER — OXYCODONE HCL 5 MG PO TABS: 5.0000 mg | ORAL_TABLET | Freq: Four times a day (QID) | ORAL | 0 refills | Status: DC | PRN

## 2018-08-19 MED ORDER — GABAPENTIN 300 MG PO CAPS
300.0000 mg | ORAL_CAPSULE | ORAL | Status: AC
  Administered 2018-08-19: 300 mg via ORAL

## 2018-08-19 MED ORDER — CHLORHEXIDINE GLUCONATE CLOTH 2 % EX PADS: 6.0000 | MEDICATED_PAD | Freq: Once | CUTANEOUS | Status: DC

## 2018-08-19 MED ORDER — DEXAMETHASONE SODIUM PHOSPHATE 10 MG/ML IJ SOLN
INTRAMUSCULAR | Status: DC | PRN
  Administered 2018-08-19: 5 mg via INTRAVENOUS

## 2018-08-19 MED ORDER — CIPROFLOXACIN IN D5W 400 MG/200ML IV SOLN
400.0000 mg | INTRAVENOUS | Status: AC
  Administered 2018-08-19: 08:00:00 400 mg via INTRAVENOUS

## 2018-08-19 MED ORDER — ONDANSETRON HCL 4 MG/2ML IJ SOLN
INTRAMUSCULAR | Status: DC | PRN
  Administered 2018-08-19: 4 mg via INTRAVENOUS

## 2018-08-19 MED ORDER — CELECOXIB 200 MG PO CAPS
200.0000 mg | ORAL_CAPSULE | ORAL | Status: AC
  Administered 2018-08-19: 200 mg via ORAL

## 2018-08-19 MED ORDER — ONDANSETRON HCL 4 MG/2ML IJ SOLN
INTRAMUSCULAR | Status: AC
  Filled 2018-08-19: qty 2

## 2018-08-19 MED ORDER — OXYCODONE HCL 5 MG PO TABS: 5.0000 mg | ORAL_TABLET | Freq: Once | ORAL | Status: DC | PRN

## 2018-08-19 MED ORDER — FENTANYL CITRATE (PF) 250 MCG/5ML IJ SOLN
INTRAMUSCULAR | Status: AC
  Filled 2018-08-19: qty 5

## 2018-08-19 MED ORDER — ACETAMINOPHEN 500 MG PO TABS
ORAL_TABLET | ORAL | Status: AC
  Administered 2018-08-19: 1000 mg via ORAL
  Filled 2018-08-19: qty 2

## 2018-08-19 SURGICAL SUPPLY — 35 items
ADH SKN CLS APL DERMABOND .7 (GAUZE/BANDAGES/DRESSINGS) ×1
CANISTER SUCT 3000ML PPV (MISCELLANEOUS) IMPLANT
COVER SURGICAL LIGHT HANDLE (MISCELLANEOUS) ×3 IMPLANT
COVER WAND RF STERILE (DRAPES) ×3 IMPLANT
DERMABOND ADVANCED (GAUZE/BANDAGES/DRESSINGS) ×2
DERMABOND ADVANCED .7 DNX12 (GAUZE/BANDAGES/DRESSINGS) ×1 IMPLANT
DRAPE LAPAROSCOPIC ABDOMINAL (DRAPES) IMPLANT
DRAPE LAPAROTOMY 100X72 PEDS (DRAPES) IMPLANT
DRSG TEGADERM 4X4.75 (GAUZE/BANDAGES/DRESSINGS) ×3 IMPLANT
ELECT REM PT RETURN 9FT ADLT (ELECTROSURGICAL) ×3
ELECTRODE REM PT RTRN 9FT ADLT (ELECTROSURGICAL) ×1 IMPLANT
GAUZE SPONGE 4X4 12PLY STRL (GAUZE/BANDAGES/DRESSINGS) ×3 IMPLANT
GLOVE SURG SIGNA 7.5 PF LTX (GLOVE) ×3 IMPLANT
GOWN STRL REUS W/ TWL LRG LVL3 (GOWN DISPOSABLE) ×1 IMPLANT
GOWN STRL REUS W/ TWL XL LVL3 (GOWN DISPOSABLE) ×1 IMPLANT
GOWN STRL REUS W/TWL LRG LVL3 (GOWN DISPOSABLE) ×3
GOWN STRL REUS W/TWL XL LVL3 (GOWN DISPOSABLE) ×3
KIT BASIN OR (CUSTOM PROCEDURE TRAY) ×3 IMPLANT
KIT TURNOVER KIT B (KITS) ×3 IMPLANT
NDL HYPO 25GX1X1/2 BEV (NEEDLE) ×1 IMPLANT
NEEDLE HYPO 25GX1X1/2 BEV (NEEDLE) ×3 IMPLANT
NS IRRIG 1000ML POUR BTL (IV SOLUTION) ×3 IMPLANT
PACK GENERAL/GYN (CUSTOM PROCEDURE TRAY) ×3 IMPLANT
PAD ABD 8X10 STRL (GAUZE/BANDAGES/DRESSINGS) ×2 IMPLANT
PAD ARMBOARD 7.5X6 YLW CONV (MISCELLANEOUS) ×3 IMPLANT
PENCIL SMOKE EVACUATOR (MISCELLANEOUS) ×3 IMPLANT
SPECIMEN JAR SMALL (MISCELLANEOUS) ×3 IMPLANT
SUT ETHILON 2 0 FS 18 (SUTURE) ×2 IMPLANT
SUT MNCRL AB 4-0 PS2 18 (SUTURE) ×3 IMPLANT
SUT VIC AB 3-0 SH 27 (SUTURE) ×3
SUT VIC AB 3-0 SH 27XBRD (SUTURE) ×1 IMPLANT
SYR CONTROL 10ML LL (SYRINGE) ×3 IMPLANT
TAPE CLOTH SURG 4X10 WHT LF (GAUZE/BANDAGES/DRESSINGS) ×2 IMPLANT
TOWEL OR 17X24 6PK STRL BLUE (TOWEL DISPOSABLE) ×3 IMPLANT
TOWEL OR 17X26 10 PK STRL BLUE (TOWEL DISPOSABLE) ×3 IMPLANT

## 2018-08-19 NOTE — Op Note (Signed)
   Lorraine [name] 08/19/2018   Pre-op Diagnosis: CHRONIC RIGHT BREAST ABSCESS     Post-op Diagnosis: same  Procedure(s): INCISION, DRAINAGE, BIOPSY OF  CHRONIC RIGHT BREAST ABSCESS  Surgeon(s): Coralie Keens, MD  Anesthesia: General  Staff:  Circulator: Deland Pretty, RN Scrub Person: Dollene Cleveland T  Estimated Blood Loss: Minimal               Specimens: sent to path  Indications: This is a 48 year old female who has had a history of bilateral breast abscesses and inverted nipples.  She has had multiple aspiration procedures by radiologist at the breast center.  Because of persistence of the abscess in the right breast, the decision was made to proceed to the operating room.  Procedure: The patient was brought to the operating room and identified the correct patient.  She was placed upon the operating room table and anesthesia was induced.  Her right breast was then prepped and draped in usual sterile fashion.  She had already developed worsening erythema and induration of the breast.  I anesthetized the medial edge of the areola with Marcaine.  I then made an incision with a scalpel.  I then entered a large abscess cavity and a large amount of purulence was evacuated.  I then excised some of the surrounding tissue with electrocautery and sent it to pathology.  There was significant bout of induration I was unable to evert the nipple.  At this point I irrigated the cavity with a liter of saline.  I then achieved hemostasis with the cautery.  I injected further Marcaine circumferentially into the wound.  I then placed 1/4 inch Penrose drain into the abscess cavity and sutured to the skin with a 2-0 nylon suture.  I then loosely reapproximated the skin with interrupted 2-0 nylon sutures.  Gauze was then placed over this.  It was then taped in place.  The patient tolerated the procedure well.  All the counts were correct at the end of the procedure.  The patient was then extubated in  the operating room and taken in a stable condition to the recovery room.          Coralie Keens   Date: 08/19/2018  Time: 8:57 AM

## 2018-08-19 NOTE — Anesthesia Preprocedure Evaluation (Addendum)
Anesthesia Evaluation  Patient identified by MRN, date of birth, ID band Patient awake    Reviewed: Allergy & Precautions, NPO status , Patient's Chart, lab work & pertinent test results  History of Anesthesia Complications Negative for: history of anesthetic complications  Airway Mallampati: I  TM Distance: >3 FB Neck ROM: Full    Dental no notable dental hx.    Pulmonary Current Smoker,    Pulmonary exam normal        Cardiovascular negative cardio ROS Normal cardiovascular exam     Neuro/Psych negative neurological ROS     GI/Hepatic negative GI ROS, Neg liver ROS,   Endo/Other  Obese, BMI 38  Renal/GU negative Renal ROS     Musculoskeletal negative musculoskeletal ROS (+)   Abdominal   Peds  Hematology negative hematology ROS (+)   Anesthesia Other Findings Day of surgery medications reviewed with the patient.  Reproductive/Obstetrics                            Anesthesia Physical Anesthesia Plan  ASA: II  Anesthesia Plan: General   Post-op Pain Management:    Induction: Intravenous  PONV Risk Score and Plan: 4 or greater and Treatment may vary due to age or medical condition, Ondansetron, Dexamethasone and Midazolam  Airway Management Planned: LMA  Additional Equipment:   Intra-op Plan:   Post-operative Plan: Extubation in OR  Informed Consent: I have reviewed the patients History and Physical, chart, labs and discussed the procedure including the risks, benefits and alternatives for the proposed anesthesia with the patient or authorized representative who has indicated his/her understanding and acceptance.     Dental advisory given  Plan Discussed with: CRNA  Anesthesia Plan Comments:        Anesthesia Quick Evaluation

## 2018-08-19 NOTE — Discharge Instructions (Signed)
Ok to remove bandage and shower starting tomorrow  Leave drain in place and cover back with a dry gauze.  Change gauze twice daily and as needed  EXPECT DRAINAGE  Ice pack, tylenol, and ibuprofen also for pain   Post Anesthesia Home Care Instructions  Activity: Get plenty of rest for the remainder of the day. A responsible individual must stay with you for 24 hours following the procedure.  For the next 24 hours, DO NOT: -Drive a car -Paediatric nurse -Drink alcoholic beverages -Take any medication unless instructed by your physician -Make any legal decisions or sign important papers.  Meals: Start with liquid foods such as gelatin or soup. Progress to regular foods as tolerated. Avoid greasy, spicy, heavy foods. If nausea and/or vomiting occur, drink only clear liquids until the nausea and/or vomiting subsides. Call your physician if vomiting continues.  Special Instructions/Symptoms: Your throat may feel dry or sore from the anesthesia or the breathing tube placed in your throat during surgery. If this causes discomfort, gargle with warm salt water. The discomfort should disappear within 24 hours.  If you had a scopolamine patch placed behind your ear for the management of post- operative nausea and/or vomiting:  1. The medication in the patch is effective for 72 hours, after which it should be removed.  Wrap patch in a tissue and discard in the trash. Wash hands thoroughly with soap and water. 2. You may remove the patch earlier than 72 hours if you experience unpleasant side effects which may include dry mouth, dizziness or visual disturbances. 3. Avoid touching the patch. Wash your hands with soap and water after contact with the patch.

## 2018-08-19 NOTE — Anesthesia Procedure Notes (Signed)
Procedure Name: LMA Insertion Date/Time: 08/19/2018 8:30 AM Performed by: Kyung Rudd, CRNA Pre-anesthesia Checklist: Patient identified, Emergency Drugs available, Suction available and Patient being monitored Patient Re-evaluated:Patient Re-evaluated prior to induction Oxygen Delivery Method: Circle system utilized Preoxygenation: Pre-oxygenation with 100% oxygen Induction Type: IV induction LMA: LMA inserted LMA Size: 4.0 Number of attempts: 1 Placement Confirmation: positive ETCO2 and breath sounds checked- equal and bilateral Tube secured with: Tape Dental Injury: Teeth and Oropharynx as per pre-operative assessment

## 2018-08-19 NOTE — Anesthesia Postprocedure Evaluation (Signed)
Anesthesia Post Note  Patient: Lorraine [name]  Procedure(s) Performed: EXCISION CHRONIC RIGHT BREAST ABSCESS (Right Breast)     Patient location during evaluation: PACU Anesthesia Type: General Level of consciousness: awake and alert Pain management: pain level controlled Vital Signs Assessment: post-procedure vital signs reviewed and stable Respiratory status: spontaneous breathing, nonlabored ventilation and respiratory function stable Cardiovascular status: blood pressure returned to baseline and stable Postop Assessment: no apparent nausea or vomiting Anesthetic complications: no    Last Vitals:  Vitals:   08/19/18 0933 08/19/18 0943  BP:  (!) 142/82  Pulse: 62   Resp: 15 18  Temp: (!) 36.3 C   SpO2: 100% 100%    Last Pain:  Vitals:   08/19/18 0943  TempSrc:   PainSc: 0-No pain                 Brennan Bailey

## 2018-08-19 NOTE — Transfer of Care (Signed)
Immediate Anesthesia Transfer of Care Note  Patient: Lorraine [name]  Procedure(s) Performed: EXCISION CHRONIC RIGHT BREAST ABSCESS (Right Breast)  Patient Location: PACU  Anesthesia Type:General  Level of Consciousness: awake, alert  and oriented  Airway & Oxygen Therapy: Patient Spontanous Breathing and Patient connected to nasal cannula oxygen  Post-op Assessment: Report given to RN, Post -op Vital signs reviewed and stable and Patient moving all extremities X 4  Post vital signs: Reviewed and stable  Last Vitals:  Vitals Value Taken Time  BP 124/86 08/19/18 0910  Temp    Pulse 67 08/19/18 0911  Resp 18 08/19/18 0911  SpO2 100 % 08/19/18 0911  Vitals shown include unvalidated device data.  Last Pain:  Vitals:   08/19/18 0745  TempSrc:   PainSc: 0-No pain      Patients Stated Pain Goal: 5 (11/30/28 0762)  Complications: No apparent anesthesia complications

## 2018-08-19 NOTE — Interval H&P Note (Signed)
History and Physical Interval Note:no change in H and P  08/19/2018 7:12 AM  Lorraine [name]  has presented today for surgery, with the diagnosis of CHRONIC RIGHT BREAST ABSCESS.  The various methods of treatment have been discussed with the patient and family. After consideration of risks, benefits and other options for treatment, the patient has consented to  Procedure(s): EXCISION CHRONIC RIGHT BREAST ABSCESS (Right) as a surgical intervention.  The patient's history has been reviewed, patient examined, no change in status, stable for surgery.  I have reviewed the patient's chart and labs.  Questions were answered to the patient's satisfaction.     Coralie Keens

## 2018-08-20 ENCOUNTER — Encounter (HOSPITAL_COMMUNITY): Payer: Self-pay | Admitting: Surgery

## 2018-09-24 ENCOUNTER — Ambulatory Visit: Payer: Self-pay

## 2018-09-28 ENCOUNTER — Ambulatory Visit: Payer: 59 | Admitting: Family Medicine

## 2018-09-28 ENCOUNTER — Ambulatory Visit: Payer: 59

## 2018-09-28 ENCOUNTER — Other Ambulatory Visit: Payer: Self-pay | Admitting: Family Medicine

## 2018-09-28 ENCOUNTER — Encounter: Payer: Self-pay | Admitting: Family Medicine

## 2018-09-28 ENCOUNTER — Other Ambulatory Visit: Payer: Self-pay

## 2018-09-28 VITALS — BP 130/72 | HR 82 | Temp 99.0°F | Resp 16 | Ht 68.0 in | Wt 254.0 lb

## 2018-09-28 DIAGNOSIS — N760 Acute vaginitis: Secondary | ICD-10-CM | POA: Diagnosis not present

## 2018-09-28 DIAGNOSIS — Z3042 Encounter for surveillance of injectable contraceptive: Secondary | ICD-10-CM

## 2018-09-28 DIAGNOSIS — E669 Obesity, unspecified: Secondary | ICD-10-CM

## 2018-09-28 DIAGNOSIS — Z30019 Encounter for initial prescription of contraceptives, unspecified: Secondary | ICD-10-CM

## 2018-09-28 DIAGNOSIS — E6609 Other obesity due to excess calories: Secondary | ICD-10-CM | POA: Diagnosis not present

## 2018-09-28 DIAGNOSIS — Z6839 Body mass index (BMI) 39.0-39.9, adult: Secondary | ICD-10-CM

## 2018-09-28 DIAGNOSIS — B9689 Other specified bacterial agents as the cause of diseases classified elsewhere: Secondary | ICD-10-CM

## 2018-09-28 LAB — WET PREP FOR TRICH, YEAST, CLUE

## 2018-09-28 MED ORDER — PHENTERMINE HCL 37.5 MG PO TABS: 37.5000 mg | ORAL_TABLET | Freq: Every day | ORAL | 1 refills | Status: DC

## 2018-09-28 MED ORDER — METRONIDAZOLE 500 MG PO TABS: 500.0000 mg | ORAL_TABLET | Freq: Two times a day (BID) | ORAL | 0 refills | Status: DC

## 2018-09-28 NOTE — Patient Instructions (Signed)
F/U 2 months for Physical and medications

## 2018-09-28 NOTE — Progress Notes (Signed)
   Subjective:    Patient ID: Lorraine [name], female    DOB: 1970/06/27, 48 y.o.   MRN: [id number]  Patient presents for Vaginitis (x1 week- small amount of discharge, foul odor)   1 week with vaginal odor,  Last treated in May 2020, oCT  2019  She has been sexually active only 1 partne, does notice more odor after intercourse  Tried tampoon dipped in tea tre/coconut oil   No itching with discharge, no abd bleeding  no abd pain      Depo Provera due for injection but did not bring it with her. No SE with meds, works well for her  Obesity- started back in April was 258lbs was not consistent with it. Planning to start tomorrow back onphentermine, so only took 1 bottle.      Review Of Systems:  GEN- denies fatigue, fever, weight loss,weakness, recent illness HEENT- denies eye drainage, change in vision, nasal discharge, CVS- denies chest pain, palpitations RESP- denies SOB, cough, wheeze ABD- denies N/V, change in stools, abd pain GU- denies dysuria, hematuria, dribbling, incontinence MSK- denies joint pain, muscle aches, injury Neuro- denies headache, dizziness, syncope, seizure activity       Objective:    BP 130/72   Pulse 82   Temp 99 F (37.2 C) (Oral)   Resp 16   Ht 5\' 8"  (1.727 m)   Wt 254 lb (115.2 kg)   SpO2 97%   BMI 38.62 kg/m  GEN- NAD, alert and oriented x3 HEENT- PERRL, EOMI, non injected sclera, pink conjunctiva, MMM, oropharynx clear Neck- Supple, no thyromegaly CVS- RRR, no murmur RESP-CTAB ABD-NABS,soft,NT,ND GU- normal external genitalia, vaginal mucosa pink and moist, cervix visualized no growth, no blood form os, + discharge, no CMT, no ovarian masses, uterus normal size EXT- No edema Pulses- Radial 2+        Assessment & Plan:      Problem List Items Addressed This Visit      Unprioritized   Contraceptive management    She will return for nurse visit for depo      Obesity    We discussed dietary changes along with the  phentermine.  She plans to start this medication will have her follow-up in a couple months for her weight at her physical exam.      Relevant Medications   phentermine (ADIPEX-P) 37.5 MG tablet    Other Visit Diagnoses    BV (bacterial vaginosis)    -  Primary   Flagylsent.  Try to get boric acid suppositories but not available through the pharmacy. she can take probiotics, urinate after sex   Relevant Medications   metroNIDAZOLE (FLAGYL) 500 MG tablet   Other Relevant Orders   WET PREP FOR [place], YEAST, CLUE (Completed)      Note: This dictation was prepared with Dragon dictation along with smaller phrase technology. Any transcriptional errors that result from this process are unintentional.

## 2018-09-28 NOTE — Assessment & Plan Note (Signed)
We discussed dietary changes along with the phentermine.  She plans to start this medication will have her follow-up in a couple months for her weight at her physical exam.

## 2018-09-28 NOTE — Assessment & Plan Note (Signed)
She will return for nurse visit for depo

## 2018-10-22 ENCOUNTER — Other Ambulatory Visit: Payer: 59

## 2018-10-22 ENCOUNTER — Ambulatory Visit: Payer: 59

## 2018-10-25 ENCOUNTER — Other Ambulatory Visit: Payer: 59

## 2018-10-25 ENCOUNTER — Ambulatory Visit (INDEPENDENT_AMBULATORY_CARE_PROVIDER_SITE_OTHER): Payer: 59 | Admitting: *Deleted

## 2018-10-25 ENCOUNTER — Other Ambulatory Visit: Payer: Self-pay

## 2018-10-25 DIAGNOSIS — Z309 Encounter for contraceptive management, unspecified: Secondary | ICD-10-CM

## 2018-10-25 DIAGNOSIS — Z3200 Encounter for pregnancy test, result unknown: Secondary | ICD-10-CM

## 2018-10-25 LAB — PREGNANCY, URINE: Preg Test, Ur: NEGATIVE

## 2018-10-25 MED ORDER — MEDROXYPROGESTERONE ACETATE 150 MG/ML IM SUSP
150.0000 mg | Freq: Once | INTRAMUSCULAR | Status: AC
  Administered 2018-10-25: 150 mg via INTRAMUSCULAR

## 2018-10-25 NOTE — Progress Notes (Signed)
Last Depo-Provera: 07/07/2018 SE: no U-PREG indicated? Yes Result: neg Next Depo- Provera: 11/2- 01/24/2019  Cautioned that administration is not immediately effective and advised to use backup method of contraception x2 additional Moch.

## 2018-10-25 NOTE — Patient Instructions (Addendum)
Pregnancy test is negative. Continue to use back up method of contraception x2 Bazar.   Depo-Provera Administered. Follow Up in 12 week for your next injection (01/10/2019- 01/24/2019)

## 2018-11-10 ENCOUNTER — Encounter: Payer: Self-pay | Admitting: Family Medicine

## 2018-11-10 ENCOUNTER — Ambulatory Visit (INDEPENDENT_AMBULATORY_CARE_PROVIDER_SITE_OTHER): Payer: 59 | Admitting: Family Medicine

## 2018-11-10 ENCOUNTER — Other Ambulatory Visit: Payer: Self-pay

## 2018-11-10 VITALS — BP 102/74 | HR 87 | Temp 98.5°F | Resp 18 | Ht 68.0 in | Wt 251.6 lb

## 2018-11-10 DIAGNOSIS — E66812 Obesity, class 2: Secondary | ICD-10-CM

## 2018-11-10 DIAGNOSIS — Z309 Encounter for contraceptive management, unspecified: Secondary | ICD-10-CM

## 2018-11-10 DIAGNOSIS — Z124 Encounter for screening for malignant neoplasm of cervix: Secondary | ICD-10-CM

## 2018-11-10 DIAGNOSIS — Z0001 Encounter for general adult medical examination with abnormal findings: Secondary | ICD-10-CM

## 2018-11-10 DIAGNOSIS — Z1239 Encounter for other screening for malignant neoplasm of breast: Secondary | ICD-10-CM

## 2018-11-10 DIAGNOSIS — T8131XA Disruption of external operation (surgical) wound, not elsewhere classified, initial encounter: Secondary | ICD-10-CM

## 2018-11-10 DIAGNOSIS — J343 Hypertrophy of nasal turbinates: Secondary | ICD-10-CM

## 2018-11-10 DIAGNOSIS — E6609 Other obesity due to excess calories: Secondary | ICD-10-CM

## 2018-11-10 DIAGNOSIS — E669 Obesity, unspecified: Secondary | ICD-10-CM

## 2018-11-10 DIAGNOSIS — Z23 Encounter for immunization: Secondary | ICD-10-CM | POA: Diagnosis not present

## 2018-11-10 DIAGNOSIS — Z6839 Body mass index (BMI) 39.0-39.9, adult: Secondary | ICD-10-CM

## 2018-11-10 DIAGNOSIS — Z9012 Acquired absence of left breast and nipple: Secondary | ICD-10-CM

## 2018-11-10 DIAGNOSIS — Z114 Encounter for screening for human immunodeficiency virus [HIV]: Secondary | ICD-10-CM | POA: Diagnosis not present

## 2018-11-10 DIAGNOSIS — Z113 Encounter for screening for infections with a predominantly sexual mode of transmission: Secondary | ICD-10-CM

## 2018-11-10 DIAGNOSIS — Z0000 Encounter for general adult medical examination without abnormal findings: Secondary | ICD-10-CM

## 2018-11-10 LAB — WET PREP FOR TRICH, YEAST, CLUE

## 2018-11-10 MED ORDER — DOXYCYCLINE HYCLATE 100 MG PO TABS: 100.0000 mg | ORAL_TABLET | Freq: Two times a day (BID) | ORAL | 0 refills | Status: DC

## 2018-11-10 MED ORDER — PHENTERMINE HCL 37.5 MG PO TABS: 37.5000 mg | ORAL_TABLET | Freq: Every day | ORAL | 2 refills | Status: DC

## 2018-11-10 MED ORDER — FLUCONAZOLE 150 MG PO TABS: 150.0000 mg | ORAL_TABLET | Freq: Once | ORAL | 1 refills | Status: AC

## 2018-11-10 NOTE — Assessment & Plan Note (Signed)
Continue phentermine Cut out sugars, will help with infections as well  Recheck in 3-4 months weight loss progression Can add MVI

## 2018-11-10 NOTE — Patient Instructions (Addendum)
F/U 4 months for medications Schedule mammogram end of Oct We will call with lab results  Take antibiotics as prescribed Schedule with eye doctor Add Multivitamin Call the surgeon if infection does not improve

## 2018-11-10 NOTE — Assessment & Plan Note (Signed)
Discussed ENT, for evaluation may need surgery Not causing significant pain or infections so will hold off at this point

## 2018-11-10 NOTE — Addendum Note (Signed)
Addended by: Vanice Sarah on: 11/10/2018 11:28 AM   Modules accepted: Orders

## 2018-11-10 NOTE — Progress Notes (Signed)
Subjective:    Patient ID: Lorraine [name], female    DOB: [birth year], 48 y.o.   MRN: [id number]  Patient presents for Annual Exam (and wants pregnancy test)   Pt here for CPE  She was on Depo Medrol for contraception, requested pregnancy test was off 2 Brunn on her shot    Obesity- taking phentermine , she missed a few days during a party, weight down 4lbs since visit in July, she is trying to cut back on sweets/sugars    - weight at home down to 248lbs     Due for fasting labs   PAP Smear due - last in  2017  Mammogram-  UTD Oct 2019   Immunizations- Due for TDAP and Flu shot today    Previous surgical spot for right chronic breast abscess ( June 2020) on side of nipple has opened again where the tube was present, no pain but some drainage, scab keeps opening and closing, nipple has a smell with some moisture  this hs been occurring over past 2 [place]     Follows with dentist   Due for eye visit, mother had gluacoma    She has noticed for years seems to be something meaty appearing in her nose, she does snore, not pain,no recurrent sinus infections, gets some congestion   Review Of Systems:  GEN- denies fatigue, fever, weight loss,weakness, recent illness HEENT- denies eye drainage, change in vision, nasal discharge, CVS- denies chest pain, palpitations RESP- denies SOB, cough, wheeze ABD- denies N/V, change in stools, abd pain GU- denies dysuria, hematuria, dribbling, incontinence MSK- denies joint pain, muscle aches, injury Neuro- denies headache, dizziness, syncope, seizure activity       Objective:    BP 102/74   Pulse 87   Temp 98.5 F (36.9 C) (Oral)   Resp 18   Ht 5\' 8"  (1.727 m)   Wt 251 lb 9.6 oz (114.1 kg)   SpO2 98%   BMI 38.26 kg/m  GEN- NAD, alert and oriented x3 HEENT- PERRL, EOMI, non injected sclera, pink conjunctiva, MMM, oropharynx clear, enlarged left turbinate Neck- Supple, no thyromegaly CVS- RRR, no  murmur RESP-CTAB ABD-NABS,soft,NT,ND GEN- NAD, alert and oriented, Neck- supple, no thyromegaly Breast- normal symmetry, no nipple inversion,pus like drainage from left nipple drainage, no nodules or lumps felt, incision  1/2 " long with dehiscence around right medial nipple clear fluid draining, no odor Nodes- no axillary nodes GU- normal external genitalia, vaginal mucosa pink and moist, cervix visualized no growth, + blood form os, minimal thin clear discharge, no CMT, no ovarian masses, uterus normal size EXT- No edema Pulses- Radial, DP- 2+   Fall/AuditC/ Depression - negative        Assessment & Plan:     Schedule with eye doctor  Problem List Items Addressed This Visit      Unprioritized   Contraceptive management   Relevant Orders   Pregnancy, urine   Nasal turbinate hypertrophy    Discussed ENT, for evaluation may need surgery Not causing significant pain or infections so will hold off at this point       Obesity    Continue phentermine Cut out sugars, will help with infections as well  Recheck in 3-4 months weight loss progression Can add MVI       Other Visit Diagnoses    Routine general medical examination at a health care facility    -  Primary   CPE done, TDAP and Flu, mammo due end of  Oct, PAP Smear done, HIV screen   Relevant Orders   Comprehensive metabolic panel   Lipid panel   CBC with Differential/Platelet   Encounter for screening for HIV       Relevant Orders   HIV Antibody (routine testing w rflx)   Cervical cancer screening       Relevant Orders   Pap IG w/ reflex to HPV when ASC-U   Breast cancer screening       Relevant Orders   MM 3D SCREEN BREAST BILATERAL   Postoperative wound dehiscence, initial encounter       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 trachomatis/N. gonorrhoeae RNA   WET PREP FOR [place], YEAST, CLUE      Note: This dictation was prepared with Dragon dictation along with smaller Lobbyistphrase technology. Any transcriptional errors that result from this process are unintentional.

## 2018-11-11 LAB — LIPID PANEL
Cholesterol: 172 mg/dL (ref <200)
HDL: 55 mg/dL (ref >=50)
LDL Cholesterol (Calc): 103 mg/dL (calc) — ABNORMAL HIGH
Non-HDL Cholesterol (Calc): 117 mg/dL (calc) (ref <130)
Total CHOL/HDL Ratio: 3.1 (calc) (ref <5.0)
Triglycerides: 55 mg/dL (ref <150)

## 2018-11-11 LAB — PAP IG W/ RFLX HPV ASCU

## 2018-11-11 LAB — COMPREHENSIVE METABOLIC PANEL
AG Ratio: 1.4 (calc) (ref 1.0–2.5)
ALT: 8 U/L (ref 6–29)
AST: 12 U/L (ref 10–35)
Albumin: 4.1 g/dL (ref 3.6–5.1)
Alkaline phosphatase (APISO): 51 U/L (ref 31–125)
BUN: 12 mg/dL (ref 7–25)
CO2: 28 mmol/L (ref 20–32)
Calcium: 9.5 mg/dL (ref 8.6–10.2)
Chloride: 106 mmol/L (ref 98–110)
Creat: 0.94 mg/dL (ref 0.50–1.10)
Globulin: 2.9 g/dL (calc) (ref 1.9–3.7)
Glucose, Bld: 88 mg/dL (ref 65–99)
Potassium: 4 mmol/L (ref 3.5–5.3)
Sodium: 139 mmol/L (ref 135–146)
Total Bilirubin: 0.5 mg/dL (ref 0.2–1.2)
Total Protein: 7 g/dL (ref 6.1–8.1)

## 2018-11-11 LAB — CBC WITH DIFFERENTIAL/PLATELET
Absolute Monocytes: 300 cells/uL (ref 200–950)
Basophils Absolute: 48 cells/uL (ref 0–200)
Basophils Relative: 0.8 %
Eosinophils Absolute: 240 cells/uL (ref 15–500)
Eosinophils Relative: 4 %
HCT: 43.2 % (ref 35.0–45.0)
Hemoglobin: 14.4 g/dL (ref 11.7–15.5)
Lymphs Abs: 2826 cells/uL (ref 850–3900)
MCH: 29.7 pg (ref 27.0–33.0)
MCHC: 33.3 g/dL (ref 32.0–36.0)
MCV: 89.1 fL (ref 80.0–100.0)
MPV: 9.1 fL (ref 7.5–12.5)
Monocytes Relative: 5 %
Neutro Abs: 2586 cells/uL (ref 1500–7800)
Neutrophils Relative %: 43.1 %
Platelets: 337 10*3/uL (ref 140–400)
RBC: 4.85 10*6/uL (ref 3.80–5.10)
RDW: 12.5 % (ref 11.0–15.0)
Total Lymphocyte: 47.1 %
WBC: 6 10*3/uL (ref 3.8–10.8)

## 2018-11-11 LAB — C. TRACHOMATIS/N. GONORRHOEAE RNA
C. trachomatis RNA, TMA: NOT DETECTED
N. gonorrhoeae RNA, TMA: NOT DETECTED

## 2018-11-11 LAB — PREGNANCY, URINE: Preg Test, Ur: NEGATIVE

## 2018-11-11 LAB — HIV ANTIBODY (ROUTINE TESTING W REFLEX): HIV 1&2 Ab, 4th Generation: NONREACTIVE

## 2018-12-01 ENCOUNTER — Other Ambulatory Visit: Payer: Self-pay

## 2018-12-01 ENCOUNTER — Ambulatory Visit (INDEPENDENT_AMBULATORY_CARE_PROVIDER_SITE_OTHER): Payer: 59

## 2018-12-01 ENCOUNTER — Ambulatory Visit (HOSPITAL_COMMUNITY)
Admission: EM | Admit: 2018-12-01 | Discharge: 2018-12-01 | Disposition: A | Discharge status: Home / Self Care | Payer: 59 | Attending: Family Medicine | Admitting: Family Medicine

## 2018-12-01 ENCOUNTER — Encounter (HOSPITAL_COMMUNITY): Payer: Self-pay | Admitting: Emergency Medicine

## 2018-12-01 DIAGNOSIS — S39012A Strain of muscle, fascia and tendon of lower back, initial encounter: Secondary | ICD-10-CM | POA: Diagnosis not present

## 2018-12-01 DIAGNOSIS — L03113 Cellulitis of right upper limb: Secondary | ICD-10-CM

## 2018-12-01 MED ORDER — DOXYCYCLINE HYCLATE 100 MG PO TABS: 100.0000 mg | ORAL_TABLET | Freq: Two times a day (BID) | ORAL | 0 refills | Status: DC

## 2018-12-01 MED ORDER — DICLOFENAC SODIUM 75 MG PO TBEC: 75.0000 mg | DELAYED_RELEASE_TABLET | Freq: Two times a day (BID) | ORAL | 0 refills | Status: DC

## 2018-12-01 NOTE — ED Triage Notes (Signed)
Pt presents to [facility] for assessment of two days of right pinky swelling, denies known injury.  Also c/o left flank pain x 4 days, denies changes in urination.  States a warm bath helped, but it returned.

## 2018-12-01 NOTE — ED Notes (Signed)
Patient able to ambulate independently  

## 2018-12-01 NOTE — Discharge Instructions (Addendum)
The x-rays do not show any underlying bony abnormality.  I believe you have a mild infection on the right hand which should be treated with antibiotics that have been prescribed.  The back pain is most consistent with a muscle strain.

## 2018-12-01 NOTE — ED Provider Notes (Signed)
[facility]    CSN: [id number] Arrival date & time: 12/01/18  1210      History   Chief Complaint Chief Complaint  Patient presents with  . Hand Pain  . Flank Pain    HPI Lorraine [name] is a 48 y.o. female.   This is an established patient here at [facility] urgent care.  Pt presents to [facility] for assessment of two days of right pinky swelling, denies known injury.  Also c/o left flank pain x 4 days, denies changes in urination.  States a warm bath helped, but it returned.       Past Medical History:  Diagnosis Date  . Allergy    Seasonal  . Seasonal allergies   . SVD (spontaneous vaginal delivery)    x 1    Patient Active Problem List   Diagnosis Date Noted  . Nasal turbinate hypertrophy 11/10/2018  . Contraceptive management 04/07/2012  . Recurrent boils 04/02/2011  . 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 Obesity 04/02/2011  . Tobacco user 04/02/2011    Past Surgical History:  Procedure Laterality Date  . CHOLECYSTECTOMY    . MASS EXCISION Right 08/19/2018   Procedure: EXCISION CHRONIC RIGHT BREAST ABSCESS;  Surgeon: Abigail Miyamoto, MD;  Location: [facility] OR;  Service: General;  Laterality: Right;  . PILONIDAL CYST EXCISION    . WISDOM TOOTH EXTRACTION      OB History   No obstetric history on file.      Home Medications    Prior to Admission medications   Medication Sig Start Date End Date Taking? Authorizing Provider  diclofenac (VOLTAREN) 75 MG EC tablet Take 1 tablet (75 mg total) by mouth 2 (two) times daily. 12/01/18   Elvina Sidle, MD  doxycycline (VIBRA-TABS) 100 MG tablet Take 1 tablet (100 mg total) by mouth 2 (two) times daily. 12/01/18   Elvina Sidle, MD  medroxyPROGESTERone Acetate 150 MG/ML SUSY Inject 1 mL (150 mg total) into the muscle every 3 (three) months. 09/28/18   Salley Scarlet, MD  phentermine (ADIPEX-P) 37.5 MG tablet Take 1 tablet (37.5 mg total) by mouth daily before breakfast. 11/10/18   Salley Scarlet, MD    Family History Family History  Problem Relation Age of Onset  . Heart disease Father   . Diabetes Father   . Thyroid disease Mother        Pituitary tumor  . Kidney disease Mother        renal tumor- benign  . Thyroid disease Sister   . Diabetes Paternal Aunt   . Diabetes Paternal Uncle   . Diabetes Maternal Grandmother     Social History Social History   Tobacco Use  . Smoking status: Current Every Day Smoker    Packs/day: 0.50    Years: 24.00    Pack years: 12.00    Types: Cigarettes  . Smokeless tobacco: Never Used  Substance Use Topics  . Alcohol use: Yes    Alcohol/week: 1.0 standard drinks    Types: 1 Glasses of wine per week    Comment: SOCIALLY (TWICE A MONTH)  . Drug use: No     Allergies   Penicillins   Review of Systems Review of Systems  Constitutional: Negative.   Musculoskeletal: Positive for back pain.  All other systems reviewed and are negative.    Physical Exam Triage Vital Signs ED Triage Vitals  Enc Vitals Group     BP 12/01/18 1239 122/86     Pulse Rate 12/01/18 1239  91     Resp 12/01/18 1239 18     Temp 12/01/18 1239 98 F (36.7 C)     Temp Source 12/01/18 1239 Tympanic     SpO2 12/01/18 1239 100 %     Weight --      Height --      Head Circumference --      Peak Flow --      Pain Score 12/01/18 1238 2     Pain Loc --      Pain Edu? --      Excl. in GC? --    No data found.  Updated Vital Signs BP 122/86 (BP Location: Right Arm)   Pulse 91   Temp 98 F (36.7 C) (Tympanic)   Resp 18   SpO2 100%    Physical Exam Vitals signs and nursing note reviewed.  Constitutional:      General: She is not in acute distress.    Appearance: Normal appearance. She is obese. She is not ill-appearing.  Eyes:     Conjunctiva/sclera: Conjunctivae normal.  Neck:     Musculoskeletal: Normal range of motion and neck supple.  Cardiovascular:     Rate and Rhythm: Normal rate.  Pulmonary:     Effort: Pulmonary effort is normal.   Musculoskeletal:        General: Swelling and tenderness present. No signs of injury.     Comments: Patient has reproducible back pain when she bends over about 45 degrees.  The pain is in her left lower lumbar region. Palpation of the lumbar region and lumbar spine yields no discomfort.  Tender ulnar side of dorsal hand with mild swelling and erythema over metacarpal  Skin:    General: Skin is warm and dry.     Findings: Erythema present. No bruising.  Neurological:     General: No focal deficit present.     Mental Status: She is alert and oriented to person, place, and time.  Psychiatric:        Mood and Affect: Mood normal.        Behavior: Behavior normal.        Thought Content: Thought content normal.        UC Treatments / Results  Labs (all labs ordered are listed, but only abnormal results are displayed) Labs Reviewed - No data to display  EKG   Radiology Dg Hand Complete Right  Result Date: 12/01/2018 CLINICAL DATA:  Right hand swelling and pain. Pain worse over fifth metacarpal. No injury. EXAM: RIGHT HAND - COMPLETE 3+ VIEW COMPARISON:  No prior. FINDINGS: Soft tissue swelling cannot be excluded. No radiopaque foreign body. No acute or focal bony abnormality. No evidence of fracture or dislocation. IMPRESSION: Soft tissue swelling cannot be excluded. No radiopaque foreign body. No acute or focal bony abnormality. Electronically Signed   By: Maisie Fus  Register   On: 12/01/2018 13:38    Procedures Procedures (including critical care time)  Medications Ordered in UC Medications - No data to display  Initial Impression / Assessment and Plan / UC Course  I have reviewed the triage vital signs and the nursing notes.  Pertinent labs & imaging results that were available during my care of the patient were reviewed by me and considered in my medical decision making (see chart for details).     Final Clinical Impressions(s) / UC Diagnoses   Final diagnoses:   Strain of lumbar region, initial encounter  Cellulitis of right hand  Discharge Instructions     The x-rays do not show any underlying bony abnormality.  I believe you have a mild infection on the right hand which should be treated with antibiotics that have been prescribed.  The back pain is most consistent with a muscle strain.    ED Prescriptions    Medication Sig Dispense Auth. Provider   diclofenac (VOLTAREN) 75 MG EC tablet Take 1 tablet (75 mg total) by mouth 2 (two) times daily. 14 tablet Robyn Haber, MD   doxycycline (VIBRA-TABS) 100 MG tablet Take 1 tablet (100 mg total) by mouth 2 (two) times daily. 20 tablet Robyn Haber, MD     I have reviewed the PDMP during this encounter.   Robyn Haber, MD 12/01/18 1344

## 2019-01-25 ENCOUNTER — Other Ambulatory Visit: Payer: Self-pay

## 2019-01-25 DIAGNOSIS — Z30019 Encounter for initial prescription of contraceptives, unspecified: Secondary | ICD-10-CM

## 2019-01-25 MED ORDER — MEDROXYPROGESTERONE ACETATE 150 MG/ML IM SUSY: 150.0000 mg | PREFILLED_SYRINGE | INTRAMUSCULAR | 1 refills | Status: DC

## 2019-01-27 ENCOUNTER — Ambulatory Visit (INDEPENDENT_AMBULATORY_CARE_PROVIDER_SITE_OTHER): Payer: 59

## 2019-01-27 DIAGNOSIS — Z3042 Encounter for surveillance of injectable contraceptive: Secondary | ICD-10-CM | POA: Diagnosis not present

## 2019-01-27 MED ORDER — MEDROXYPROGESTERONE ACETATE 150 MG/ML IM SUSP
150.0000 mg | Freq: Once | INTRAMUSCULAR | Status: AC
  Administered 2019-01-27: 150 mg via INTRAMUSCULAR

## 2019-03-14 ENCOUNTER — Ambulatory Visit: Payer: 59 | Admitting: Family Medicine

## 2019-04-18 ENCOUNTER — Ambulatory Visit: Payer: 59

## 2019-05-17 ENCOUNTER — Ambulatory Visit: Payer: 59

## 2019-06-29 ENCOUNTER — Encounter: Payer: Self-pay | Admitting: Family Medicine

## 2019-06-29 ENCOUNTER — Ambulatory Visit: Payer: 59 | Admitting: Family Medicine

## 2019-06-29 ENCOUNTER — Other Ambulatory Visit: Payer: Self-pay

## 2019-06-29 VITALS — BP 128/64 | HR 98 | Temp 97.8°F | Resp 14 | Ht 68.0 in | Wt 265.0 lb

## 2019-06-29 DIAGNOSIS — Z3042 Encounter for surveillance of injectable contraceptive: Secondary | ICD-10-CM

## 2019-06-29 DIAGNOSIS — L708 Other acne: Secondary | ICD-10-CM

## 2019-06-29 DIAGNOSIS — B354 Tinea corporis: Secondary | ICD-10-CM | POA: Diagnosis not present

## 2019-06-29 DIAGNOSIS — E669 Obesity, unspecified: Secondary | ICD-10-CM

## 2019-06-29 DIAGNOSIS — E66813 Obesity, class 3: Secondary | ICD-10-CM

## 2019-06-29 DIAGNOSIS — L089 Local infection of the skin and subcutaneous tissue, unspecified: Secondary | ICD-10-CM

## 2019-06-29 DIAGNOSIS — Z6839 Body mass index (BMI) 39.0-39.9, adult: Secondary | ICD-10-CM

## 2019-06-29 LAB — PREGNANCY, URINE: Preg Test, Ur: NEGATIVE

## 2019-06-29 MED ORDER — MEDROXYPROGESTERONE ACETATE 150 MG/ML IM SUSP
150.0000 mg | Freq: Once | INTRAMUSCULAR | Status: AC
  Administered 2019-06-29: 150 mg via INTRAMUSCULAR

## 2019-06-29 MED ORDER — CLINDAMYCIN PHOS-BENZOYL PEROX 1-5 % EX GEL: Freq: Two times a day (BID) | CUTANEOUS | 2 refills | Status: DC

## 2019-06-29 MED ORDER — CLOTRIMAZOLE-BETAMETHASONE 1-0.05 % EX CREA: 1.0000 "application " | TOPICAL_CREAM | Freq: Two times a day (BID) | CUTANEOUS | 0 refills | Status: DC

## 2019-06-29 MED ORDER — PHENTERMINE HCL 37.5 MG PO TABS: 37.5000 mg | ORAL_TABLET | Freq: Every day | ORAL | 2 refills | Status: DC

## 2019-06-29 NOTE — Assessment & Plan Note (Signed)
Discussed with pt She is increasing her water. Cutting down on the soda. Also can increase veggies and protein with each meal. She is already walking for exercise. We will restart phentermine will follow her up in 4 [place].

## 2019-06-29 NOTE — Progress Notes (Signed)
Subjective:    Patient ID: Lorraine [name], female    DOB: 03/28/1970, 49 y.o.   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 She has been using clotrimazole for the past week it is helped some.   Has acne lesions on face on cheeks,chin, forehead , no change in skincare or make up routine    Uses nivea cocoa butter nad plain to wash    Wants to continue with Depo Shot, she doesn't have signficant , she wants to continue depo   She does have break through bleeding- light spotting    She was on phentermine- she was not taking consistently. She would like to restart the medication she is improving her overall nutrition.  She is still drinking a lot of soda, trying to substitute with diet   she recently brought 70 ounces    24 hour recall   [place] dog with onions/peppers,brussells , pepsi    2 pack oreo- snack   Lunch- rice bowl chicken/ veggies/ water     No breakfast    Physical activity- she has treadmill in garage, started walking 2 miles, also walks in neighborhood     COVID  19 vaccine UTD, finished Feb 15th    Concerned about food allergies/ her skin breaks out and the recurrent breast cyst. States that her sister was diagnosed with a lot of food allergies recently with her skin issues she wonders if something she is eating.  She would like to return to the surgeon for recheck she still has the pinpoint holes which gets some drainage from the breast bilaterally    Review Of Systems:  GEN- denies fatigue, fever, weight loss,weakness, recent illness HEENT- denies eye drainage, change in vision, nasal discharge, CVS- denies chest pain, palpitations RESP- denies SOB, cough, wheeze ABD- denies N/V,  change in stools, abd pain GU- denies dysuria, hematuria, dribbling, incontinence MSK- denies joint pain, muscle aches, injury Neuro- denies headache, dizziness, syncope, seizure activity       Objective:    BP 128/64   Pulse 98   Temp 97.8 F (36.6 C) (Temporal)   Resp 14   Ht 5\' 8"  (1.727 m)   Wt 265 lb (120.2 kg)   SpO2 96%   BMI 40.29 kg/m  GEN- NAD, alert and oriented x3 HEENT- PERRL, EOMI, non injected sclera, pink conjunctiva, MMM, oropharynx clear Neck- Supple, no thyromegaly CVS- RRR, no murmur RESP-CTAB Skin- acne like lesions on cheeks, forehead, chin  Right forearm raised circular scaly lesion dime size, 2 small lesions 1 cm above  EXT- No edema Pulses- Radial, DP- 2+        Assessment & Plan:      Problem List Items Addressed This Visit      Unprioritized   Class 3 obesity    Discussed with pt She is increasing her water. Cutting down on the soda. Also can increase veggies and protein with each meal. She is already walking for exercise. We will restart phentermine will follow her up in 4 [place].      Relevant Medications   phentermine (ADIPEX-P) 37.5 MG tablet   Other Relevant Orders   CBC with Differential/Platelet   Comprehensive metabolic  panel   Hemoglobin A1c   Lipid panel   Contraceptive management - Primary   Relevant Orders   Pregnancy, urine (Completed)    Other Visit Diagnoses    Acne-like skin bumps       Trial of benzclin to face    Tinea corporis       Relevant Medications   clotrimazole-betamethasone (LOTRISONE) cream   Other Relevant Orders   CBC with Differential/Platelet   Class 2 obesity with body mass index (BMI) of 39.0 to 39.9 in adult, unspecified obesity type, unspecified whether serious comorbidity present       has used phentermine several times in the past year, overall only 3 months, not consecutive, will refill   Relevant Medications   phentermine (ADIPEX-P) 37.5 MG tablet   Recurrent infection of skin        Referral to allergist Pt will contact breast surgeon as well for recheck    Relevant Medications   clotrimazole-betamethasone (LOTRISONE) cream   Other Relevant Orders   Ambulatory referral to Allergy      Note: This dictation was prepared with Dragon dictation along with smaller phrase technology. Any transcriptional errors that result from this process are unintentional.

## 2019-06-29 NOTE — Patient Instructions (Addendum)
Call [facility] [phone] - Dr. Magnus Ivan  Referral to allergist  Use lotrisone for arm Use benzaclin for your face  F/U 1 month for weight

## 2019-06-30 LAB — CBC WITH DIFFERENTIAL/PLATELET
Absolute Monocytes: 405 cells/uL (ref 200–950)
Basophils Absolute: 51 cells/uL (ref 0–200)
Basophils Relative: 0.9 %
Eosinophils Absolute: 291 cells/uL (ref 15–500)
Eosinophils Relative: 5.1 %
HCT: 42.7 % (ref 35.0–45.0)
Hemoglobin: 14.2 g/dL (ref 11.7–15.5)
Lymphs Abs: 2480 cells/uL (ref 850–3900)
MCH: 30.1 pg (ref 27.0–33.0)
MCHC: 33.3 g/dL (ref 32.0–36.0)
MCV: 90.7 fL (ref 80.0–100.0)
MPV: 8.7 fL (ref 7.5–12.5)
Monocytes Relative: 7.1 %
Neutro Abs: 2474 cells/uL (ref 1500–7800)
Neutrophils Relative %: 43.4 %
Platelets: 360 10*3/uL (ref 140–400)
RBC: 4.71 10*6/uL (ref 3.80–5.10)
RDW: 11.9 % (ref 11.0–15.0)
Total Lymphocyte: 43.5 %
WBC: 5.7 10*3/uL (ref 3.8–10.8)

## 2019-06-30 LAB — LIPID PANEL
Cholesterol: 153 mg/dL (ref <200)
HDL: 54 mg/dL (ref >=50)
LDL Cholesterol (Calc): 86 mg/dL (calc)
Non-HDL Cholesterol (Calc): 99 mg/dL (calc) (ref <130)
Total CHOL/HDL Ratio: 2.8 (calc) (ref <5.0)
Triglycerides: 58 mg/dL (ref <150)

## 2019-06-30 LAB — COMPREHENSIVE METABOLIC PANEL
AG Ratio: 1.2 (calc) (ref 1.0–2.5)
ALT: 10 U/L (ref 6–29)
AST: 14 U/L (ref 10–35)
Albumin: 3.7 g/dL (ref 3.6–5.1)
Alkaline phosphatase (APISO): 58 U/L (ref 31–125)
BUN: 9 mg/dL (ref 7–25)
CO2: 27 mmol/L (ref 20–32)
Calcium: 9.1 mg/dL (ref 8.6–10.2)
Chloride: 105 mmol/L (ref 98–110)
Creat: 0.76 mg/dL (ref 0.50–1.10)
Globulin: 3 g/dL (calc) (ref 1.9–3.7)
Glucose, Bld: 84 mg/dL (ref 65–99)
Potassium: 4 mmol/L (ref 3.5–5.3)
Sodium: 138 mmol/L (ref 135–146)
Total Bilirubin: 0.6 mg/dL (ref 0.2–1.2)
Total Protein: 6.7 g/dL (ref 6.1–8.1)

## 2019-06-30 LAB — HEMOGLOBIN A1C
Hgb A1c MFr Bld: 5.1 % of total Hgb (ref <5.7)
Mean Plasma Glucose: 100 (calc)
eAG (mmol/L): 5.5 (calc)

## 2019-07-04 ENCOUNTER — Encounter: Payer: Self-pay | Admitting: *Deleted

## 2019-07-29 ENCOUNTER — Ambulatory Visit: Payer: 59 | Admitting: Family Medicine

## 2019-08-02 ENCOUNTER — Telehealth: Payer: Self-pay | Admitting: *Deleted

## 2019-08-02 NOTE — Telephone Encounter (Signed)
Received call from patient.   Requested order for diagnostic mammogram.   Per PCP, patient was to F/U with surgeon for draining cyst to R breast.   Advised to have patient F/U with surgeon to determine what type of imaging will be required.   Verbalized understanding.

## 2019-08-10 ENCOUNTER — Other Ambulatory Visit: Payer: Self-pay | Admitting: Orthopaedic Surgery

## 2019-08-10 ENCOUNTER — Other Ambulatory Visit: Payer: Self-pay | Admitting: Surgery

## 2019-08-10 DIAGNOSIS — N6452 Nipple discharge: Secondary | ICD-10-CM

## 2019-08-22 ENCOUNTER — Ambulatory Visit
Admission: RE | Admit: 2019-08-22 | Discharge: 2019-08-22 | Disposition: A | Discharge status: Home / Self Care | Payer: 59 | Source: Ambulatory Visit | Attending: Surgery | Admitting: Surgery

## 2019-08-22 ENCOUNTER — Other Ambulatory Visit: Payer: Self-pay | Admitting: Surgery

## 2019-08-22 ENCOUNTER — Other Ambulatory Visit: Payer: Self-pay

## 2019-08-22 DIAGNOSIS — N6452 Nipple discharge: Secondary | ICD-10-CM

## 2019-08-22 DIAGNOSIS — R599 Enlarged lymph nodes, unspecified: Secondary | ICD-10-CM

## 2019-08-23 ENCOUNTER — Encounter: Payer: Self-pay | Admitting: Allergy and Immunology

## 2019-08-23 ENCOUNTER — Other Ambulatory Visit: Payer: Self-pay

## 2019-08-23 ENCOUNTER — Ambulatory Visit: Payer: 59 | Admitting: Allergy and Immunology

## 2019-08-23 VITALS — BP 130/60 | HR 84 | Temp 97.4°F | Resp 16 | Ht 68.0 in | Wt 258.8 lb

## 2019-08-23 DIAGNOSIS — L0293 Carbuncle, unspecified: Secondary | ICD-10-CM | POA: Diagnosis not present

## 2019-08-23 DIAGNOSIS — F17210 Nicotine dependence, cigarettes, uncomplicated: Secondary | ICD-10-CM

## 2019-08-23 DIAGNOSIS — L308 Other specified dermatitis: Secondary | ICD-10-CM | POA: Diagnosis not present

## 2019-08-23 DIAGNOSIS — L710 Perioral dermatitis: Secondary | ICD-10-CM

## 2019-08-23 DIAGNOSIS — L989 Disorder of the skin and subcutaneous tissue, unspecified: Secondary | ICD-10-CM

## 2019-08-23 NOTE — Progress Notes (Signed)
[place] - [place] - [place] - [place] - [place]   Dear Dr. Jeanice Lim,  Thank you for referring Lorraine [name] to the [facility] [place] [facility] on 08/23/2019.   Below is a summation of this patient's evaluation and recommendations.  Thank you for your referral. I will keep you informed about this patient's response to treatment.   If you have any questions please do not hesitate to contact me.   Sincerely,  Jessica Priest, MD Allergy / Immunology [facility]   ______________________________________________________________________    NEW PATIENT NOTE  Referring Provider: Salley Scarlet, MD Primary Provider: Salley Scarlet, MD Date of office visit: 08/23/2019    Subjective:   Chief Complaint:  Lorraine [name] (DOB: 1970/10/19) is a 49 y.o. female who presents to the clinic on 08/23/2019 with a chief complaint of Rash .     HPI: Lorraine [name] presents to this clinic in evaluation of recurrent skin problems.  Over the course of the past several months she has developed rashes on her face.  This issue initially started on her cheeks and mouth and was very dry and then it spread to the rest of her face and somewhat down to her neck.  She was given clindamycin cream which did not help this issue.  She started using a combination of Chlortrimazole and clobetasol (he Lotrisone) that she used on her right arm fungal dermatitis and this apparently worked very well in eliminating the issue on her face.  She has been using this medication for about 3 [place] or so.  In addition, she states that she has been having recurrent abscesses.  Apparently she develops "boils" under her axilla and her breast and her groin since her teenage years and this appears to occur about every 3 months or so and now she is developing breast abscesses that required a surgical procedure in June 2020.  In addition she has a  history of seasonal allergic rhinoconjunctivitis for which she will take a Claritin which works quite well.  She smokes about a half a pack of cigarettes per day usually for stress reduction.  She consumes multiple caffeinated drinks throughout the day.  She has obtained two Pfizer Covid vaccinations.  Past Medical History:  Diagnosis Date  . Allergy    Seasonal  . Seasonal allergies   . SVD (spontaneous vaginal delivery)    x 1    Past Surgical History:  Procedure Laterality Date  . CHOLECYSTECTOMY    . MASS EXCISION Right 08/19/2018   Procedure: EXCISION CHRONIC RIGHT BREAST ABSCESS;  Surgeon: Abigail Miyamoto, MD;  Location: [facility] OR;  Service: General;  Laterality: Right;  . PILONIDAL CYST EXCISION    . WISDOM TOOTH EXTRACTION      Allergies as of 08/23/2019      Reactions   Penicillins Other (See Comments)   Did it involve swelling of the face/tongue/throat, SOB, or low BP? Unknown Did it involve sudden or severe rash/hives, skin peeling, or any reaction on the inside of your mouth or nose? Unknown Did you need to seek medical attention at a hospital or doctor's office? Unknown When did it last happen?childhood reaction If all above answers are "NO", may proceed with cephalosporin use.      Medication List      clindamycin-benzoyl peroxide gel Commonly known as: BenzaClin Apply topically 2 (two) times daily. To face   clotrimazole-betamethasone cream Commonly known as: Lotrisone  Apply 1 application topically 2 (two) times daily.   Loratadine 5 MG Tbdp Take 1 tablet by mouth daily.   medroxyPROGESTERone Acetate 150 MG/ML Susy Inject 1 mL (150 mg total) into the muscle every 3 (three) months.   phentermine 37.5 MG tablet Commonly known as: ADIPEX-P Take 1 tablet (37.5 mg total) by mouth daily before breakfast.       Review of systems negative except as noted in HPI / PMHx or noted below:  Review of Systems  Constitutional: Negative.   HENT: Negative.     Eyes: Negative.   Respiratory: Negative.   Cardiovascular: Negative.   Gastrointestinal: Negative.   Genitourinary: Negative.   Musculoskeletal: Negative.   Skin: Negative.   Neurological: Negative.   Endo/Heme/Allergies: Negative.   Psychiatric/Behavioral: Negative.     Family History  Problem Relation Age of Onset  . Heart disease Father   . Diabetes Father   . Thyroid disease Mother        Pituitary tumor  . Kidney disease Mother        renal tumor- benign  . Thyroid disease Sister   . Diabetes Paternal Aunt   . Diabetes Paternal Uncle   . Diabetes Maternal Grandmother     Social History   Socioeconomic History  . Marital status: Significant Other    Spouse name: Not on file  . Number of children: Not on file  . Years of education: Not on file  . Highest education level: Not on file  Occupational History  . Not on file  Tobacco Use  . Smoking status: Current Every Day Smoker    Packs/day: 0.50    Years: 24.00    Pack years: 12.00    Types: Cigarettes  . Smokeless tobacco: Never Used  Vaping Use  . Vaping Use: Never used  Substance and Sexual Activity  . Alcohol use: Yes    Alcohol/week: 1.0 standard drink    Types: 1 Glasses of wine per week    Comment: SOCIALLY (TWICE A MONTH)  . Drug use: No  . Sexual activity: Yes    Birth control/protection: Condom, Injection  Other Topics Concern  . Not on file  Social History Narrative  . Not on file    Environmental and Social history  Lives in a house with a dry environment, no animals located to the household, carpet in the bedroom, no plastic on the bed, no plastic on the pillow, and actively smoking tobacco products.  She works as a Education officer, museum.  Objective:   Vitals:   08/23/19 1433  BP: 130/60  Pulse: 84  Resp: 16  Temp: (!) 97.4 F (36.3 C)  SpO2: 98%   Height: 5\' 8"  (172.7 cm) Weight: 258 lb 12.8 oz (117.4 kg)  Physical Exam Constitutional:      Appearance: She is not diaphoretic.   HENT:     Head: Normocephalic.     Right Ear: Tympanic membrane, ear canal and external ear normal.     Left Ear: Tympanic membrane, ear canal and external ear normal.     Nose: Nose normal. No mucosal edema or rhinorrhea.     Mouth/Throat:     Pharynx: Uvula midline. No oropharyngeal exudate.  Eyes:     Conjunctiva/sclera: Conjunctivae normal.  Neck:     Thyroid: No thyromegaly.     Trachea: Trachea normal. No tracheal tenderness or tracheal deviation.  Cardiovascular:     Rate and Rhythm: Normal rate and regular rhythm.     Heart  sounds: Normal heart sounds, S1 normal and S2 normal. No murmur heard.   Pulmonary:     Effort: No respiratory distress.     Breath sounds: Normal breath sounds. No stridor. No wheezing or rales.  Lymphadenopathy:     Head:     Right side of head: No tonsillar adenopathy.     Left side of head: No tonsillar adenopathy.     Cervical: No cervical adenopathy.  Skin:    Findings: No erythema or rash (Perioral acneiform-like lesions.).     Nails: There is no clubbing.  Neurological:     Mental Status: She is alert.     Diagnostics: Allergy skin tests were not performed.   Assessment and Plan:    1. Perioral dermatitis   2. Inflammatory dermatosis   3. Recurrent boils   4. Light tobacco smoker <10 cigarettes per day     1.  Allergen avoidance measures?  2.  MetroCream applied to face twice a day  3.  Bleach bath (1/4 cup in tub of water x 5-10 min) 1-7 times per week  4.  Use nicotine substitutes to replace tobacco smoke exposure  5.  Consolidate caffeine consumption  6.  Further evaluation and treatment?  7.  Return to clinic in 8 [place] or earlier if problem  I think the way to approach Lorraine [name]'s dermatitis is to treat her with metronidazole on a consistent basis and remove her Lotrisone treatment.  I am somewhat worried that with continued use of clobetasol to her face she will develop a steroid induced rosacea-like dermatitis.   Hopefully the MetroCream will result in good control of this issue.  She also appears to have some difficulty with bacterial overgrowth on her skin giving rise to recurrent "boils" and abscesses and hopefully with a intermittent bleach bath we can decrease the bacterial load on her skin and prevent her from developing this issue.  Finally, she needs to find different hobby other than tobacco smoking.  She uses nicotine as a stress reducer and I asked her to continue to use nicotine but in a different form then that form accompanied by smoke inhalation.  I will see her back in his clinic in 8 [place] to assess her response to this approach.  Jessica Priest, MD Allergy / Immunology [facility] [place]

## 2019-08-23 NOTE — Patient Instructions (Addendum)
  1.  Allergen avoidance measures?  2.  MetroCream applied to face twice a day  3.  Bleach bath (1/4 cup in tub of water x 5-10 min) 1-7 times per week  4.  Use nicotine substitutes to replace tobacco smoke exposure  5.  Consolidate caffeine consumption  6.  Further evaluation and treatment?  7.  Return to clinic in 8 [place] or earlier if problem

## 2019-08-24 ENCOUNTER — Encounter: Payer: Self-pay | Admitting: Allergy and Immunology

## 2019-08-24 MED ORDER — METRONIDAZOLE 0.75 % EX CREA: TOPICAL_CREAM | CUTANEOUS | 1 refills | Status: DC

## 2019-09-30 ENCOUNTER — Other Ambulatory Visit: Payer: Self-pay | Admitting: Surgery

## 2019-10-18 ENCOUNTER — Ambulatory Visit: Payer: 59 | Admitting: Allergy and Immunology

## 2019-10-25 ENCOUNTER — Encounter (HOSPITAL_BASED_OUTPATIENT_CLINIC_OR_DEPARTMENT_OTHER): Payer: Self-pay | Admitting: Surgery

## 2019-10-25 ENCOUNTER — Other Ambulatory Visit: Payer: Self-pay

## 2019-10-27 ENCOUNTER — Other Ambulatory Visit (HOSPITAL_COMMUNITY)
Admission: RE | Admit: 2019-10-27 | Discharge: 2019-10-27 | Disposition: A | Discharge status: Home / Self Care | Payer: 59 | Source: Ambulatory Visit | Attending: Surgery | Admitting: Surgery

## 2019-10-27 DIAGNOSIS — Z20822 Contact with and (suspected) exposure to covid-19: Secondary | ICD-10-CM | POA: Insufficient documentation

## 2019-10-27 DIAGNOSIS — Z01812 Encounter for preprocedural laboratory examination: Secondary | ICD-10-CM | POA: Diagnosis not present

## 2019-10-27 LAB — SARS CORONAVIRUS 2 (TAT 6-24 HRS): SARS Coronavirus 2: NEGATIVE

## 2019-10-28 NOTE — Progress Notes (Signed)

## 2019-10-30 NOTE — H&P (Signed)
   Lorraine [name]  Location: [facility] Patient #: [id number] DOB: 09-Mar-1971 Single / Language: [place] / Race: Black or African American Female   History of Present Illness  The patient is a 49 year old female who presents with a complaint of Breast problems.  Chief complaint: Nipple discharge  She is here for a follow-up of her bilateral mammograms and ultrasounds. After I saw her on June 2, she had a breakdown of her old incision on the right breast with drainage. She continues to have purulent discharge from the nipple of the left breast. Her mammograms are unremarkable. Ultrasound showed a fluid collection which measured almost 2 cm in the right breast but no abnormalities in the left breast. She did have enlarged lymph node in her right axilla which felt to be reactive. She went ahead and started taking the doxycycline and had resolution of the wound on the right breast but the discharge from the left nipple persists with no discomfort.   Allergies (Jacqueline Romney, RMA;  Penicillins  Allergies Reconciled   Medication History (Jacqueline Haggett, RMA; Depo-Provera (400MG /ML Suspension, Intramuscular) Active. Phentermine HCl (37.5MG  Tablet, Oral) Active. Medications Reconciled  Vitals (Jacqueline Haggett RMA;  Weight: 256 lb Height: 68in Body Surface Area: 2.27 m Body Mass Index: 38.92 kg/m  Temp.: 98.12F (Temporal)  Pulse: 110 (Regular)  P.OX: 100% (Room air) BP: 130/90(Sitting, Left Arm, Standard)      Physical Exam ( The physical exam findings are as follows: Note: She appears well in exam  The wound on the right breast is completely healed. There is purulent milky discharge from the left breast and suggestion of a cyst at the nipple with no other palpable masses in either breast.    Assessment & Plan   DISCHARGE FROM LEFT NIPPLE (N64.52) ABSCESS OF BREAST, RIGHT (N61.1)  Impression: I reviewed the patient's  mammograms and ultrasound. I long discussion with regarding her breast problems. We discussed proceeding with surgery versus getting an MRI preoperatively. We have decided to go ahead and proceed with surgery which would be a ductal excision on both her breasts. I would send tissue from both areas for pathology to rule out malignancy. I do not see an indication currently to continue antibiotics. We discussed the risk of surgery which includes but is not limited to bleeding, infection, recurrent infections and the breast with recurrent nipple discharge, the possibility of further procedures of malignancy is found, cardiopulmonary issues, etc. She understands and wished to proceed with surgery on both her breasts.

## 2019-10-31 ENCOUNTER — Ambulatory Visit (HOSPITAL_BASED_OUTPATIENT_CLINIC_OR_DEPARTMENT_OTHER): Payer: 59 | Admitting: Anesthesiology

## 2019-10-31 ENCOUNTER — Encounter (HOSPITAL_BASED_OUTPATIENT_CLINIC_OR_DEPARTMENT_OTHER): Payer: Self-pay | Admitting: Surgery

## 2019-10-31 ENCOUNTER — Encounter (HOSPITAL_BASED_OUTPATIENT_CLINIC_OR_DEPARTMENT_OTHER)
Admission: RE | Disposition: A | Discharge status: Home / Self Care | Payer: Self-pay | Source: Home / Self Care | Attending: Surgery

## 2019-10-31 ENCOUNTER — Ambulatory Visit (HOSPITAL_BASED_OUTPATIENT_CLINIC_OR_DEPARTMENT_OTHER)
Admission: RE | Admit: 2019-10-31 | Discharge: 2019-10-31 | Disposition: A | Discharge status: Home / Self Care | Payer: 59 | Attending: Surgery | Admitting: Surgery

## 2019-10-31 ENCOUNTER — Other Ambulatory Visit: Payer: Self-pay

## 2019-10-31 DIAGNOSIS — N6452 Nipple discharge: Secondary | ICD-10-CM | POA: Insufficient documentation

## 2019-10-31 DIAGNOSIS — N611 Abscess of the breast and nipple: Secondary | ICD-10-CM | POA: Insufficient documentation

## 2019-10-31 DIAGNOSIS — Z793 Long term (current) use of hormonal contraceptives: Secondary | ICD-10-CM | POA: Diagnosis not present

## 2019-10-31 DIAGNOSIS — F17200 Nicotine dependence, unspecified, uncomplicated: Secondary | ICD-10-CM | POA: Diagnosis not present

## 2019-10-31 DIAGNOSIS — L732 Hidradenitis suppurativa: Secondary | ICD-10-CM | POA: Diagnosis not present

## 2019-10-31 DIAGNOSIS — Z6838 Body mass index (BMI) 38.0-38.9, adult: Secondary | ICD-10-CM | POA: Diagnosis not present

## 2019-10-31 DIAGNOSIS — L720 Epidermal cyst: Secondary | ICD-10-CM | POA: Insufficient documentation

## 2019-10-31 HISTORY — PX: BREAST DUCTAL SYSTEM EXCISION: SHX5242

## 2019-10-31 LAB — POCT PREGNANCY, URINE: Preg Test, Ur: NEGATIVE

## 2019-10-31 SURGERY — EXCISION DUCTAL SYSTEM BREAST
Anesthesia: General | Site: Breast | Laterality: Bilateral

## 2019-10-31 MED ORDER — LIDOCAINE 2% (20 MG/ML) 5 ML SYRINGE
INTRAMUSCULAR | Status: AC
  Filled 2019-10-31: qty 5

## 2019-10-31 MED ORDER — BUPIVACAINE HCL (PF) 0.5 % IJ SOLN
INTRAMUSCULAR | Status: DC | PRN
  Administered 2019-10-31: 26 mL

## 2019-10-31 MED ORDER — MIDAZOLAM HCL 2 MG/2ML IJ SOLN
INTRAMUSCULAR | Status: AC
  Filled 2019-10-31: qty 2

## 2019-10-31 MED ORDER — DEXAMETHASONE SODIUM PHOSPHATE 4 MG/ML IJ SOLN
INTRAMUSCULAR | Status: DC | PRN
  Administered 2019-10-31: 10 mg via INTRAVENOUS

## 2019-10-31 MED ORDER — FENTANYL CITRATE (PF) 100 MCG/2ML IJ SOLN
INTRAMUSCULAR | Status: AC
  Filled 2019-10-31: qty 2

## 2019-10-31 MED ORDER — ONDANSETRON HCL 4 MG/2ML IJ SOLN
INTRAMUSCULAR | Status: AC
  Filled 2019-10-31: qty 2

## 2019-10-31 MED ORDER — PHENYLEPHRINE 40 MCG/ML (10ML) SYRINGE FOR IV PUSH (FOR BLOOD PRESSURE SUPPORT)
PREFILLED_SYRINGE | INTRAVENOUS | Status: AC
  Filled 2019-10-31: qty 10

## 2019-10-31 MED ORDER — ACETAMINOPHEN 500 MG PO TABS
1000.0000 mg | ORAL_TABLET | ORAL | Status: AC
  Administered 2019-10-31: 1000 mg via ORAL

## 2019-10-31 MED ORDER — OXYCODONE HCL 5 MG PO TABS: 5.0000 mg | ORAL_TABLET | Freq: Once | ORAL | Status: DC | PRN

## 2019-10-31 MED ORDER — DIPHENHYDRAMINE HCL 50 MG/ML IJ SOLN
INTRAMUSCULAR | Status: AC
  Filled 2019-10-31: qty 1

## 2019-10-31 MED ORDER — FENTANYL CITRATE (PF) 100 MCG/2ML IJ SOLN
INTRAMUSCULAR | Status: DC | PRN
Start: 2019-10-31 — End: 2019-10-31
  Administered 2019-10-31 (×2): 50 ug via INTRAVENOUS

## 2019-10-31 MED ORDER — PROPOFOL 500 MG/50ML IV EMUL
INTRAVENOUS | Status: AC
  Filled 2019-10-31: qty 50

## 2019-10-31 MED ORDER — EPHEDRINE 5 MG/ML INJ
INTRAVENOUS | Status: AC
  Filled 2019-10-31: qty 10

## 2019-10-31 MED ORDER — MIDAZOLAM HCL 5 MG/5ML IJ SOLN
INTRAMUSCULAR | Status: DC | PRN
  Administered 2019-10-31: 2 mg via INTRAVENOUS

## 2019-10-31 MED ORDER — ENSURE PRE-SURGERY PO LIQD: 296.0000 mL | Freq: Once | ORAL | Status: DC

## 2019-10-31 MED ORDER — CELECOXIB 200 MG PO CAPS
ORAL_CAPSULE | ORAL | Status: AC
  Filled 2019-10-31: qty 2

## 2019-10-31 MED ORDER — DIPHENHYDRAMINE HCL 50 MG/ML IJ SOLN
INTRAMUSCULAR | Status: DC | PRN
  Administered 2019-10-31: 6.25 mg via INTRAVENOUS

## 2019-10-31 MED ORDER — CIPROFLOXACIN IN D5W 400 MG/200ML IV SOLN
400.0000 mg | INTRAVENOUS | Status: AC
  Administered 2019-10-31: 400 mg via INTRAVENOUS

## 2019-10-31 MED ORDER — CIPROFLOXACIN IN D5W 400 MG/200ML IV SOLN
INTRAVENOUS | Status: AC
  Filled 2019-10-31: qty 200

## 2019-10-31 MED ORDER — DEXAMETHASONE SODIUM PHOSPHATE 10 MG/ML IJ SOLN
INTRAMUSCULAR | Status: AC
  Filled 2019-10-31: qty 1

## 2019-10-31 MED ORDER — ONDANSETRON HCL 4 MG/2ML IJ SOLN
INTRAMUSCULAR | Status: DC | PRN
  Administered 2019-10-31: 4 mg via INTRAVENOUS

## 2019-10-31 MED ORDER — CHLORHEXIDINE GLUCONATE CLOTH 2 % EX PADS: 6.0000 | MEDICATED_PAD | Freq: Once | CUTANEOUS | Status: DC

## 2019-10-31 MED ORDER — ACETAMINOPHEN 500 MG PO TABS
ORAL_TABLET | ORAL | Status: AC
  Filled 2019-10-31: qty 2

## 2019-10-31 MED ORDER — LACTATED RINGERS IV SOLN: INTRAVENOUS | Status: DC

## 2019-10-31 MED ORDER — SUCCINYLCHOLINE CHLORIDE 200 MG/10ML IV SOSY
PREFILLED_SYRINGE | INTRAVENOUS | Status: AC
  Filled 2019-10-31: qty 10

## 2019-10-31 MED ORDER — LIDOCAINE HCL (CARDIAC) PF 100 MG/5ML IV SOSY
PREFILLED_SYRINGE | INTRAVENOUS | Status: DC | PRN
  Administered 2019-10-31: 60 mg via INTRAVENOUS

## 2019-10-31 MED ORDER — TRAMADOL HCL 50 MG PO TABS: 50.0000 mg | ORAL_TABLET | Freq: Four times a day (QID) | ORAL | 0 refills | Status: DC | PRN

## 2019-10-31 MED ORDER — GABAPENTIN 300 MG PO CAPS
300.0000 mg | ORAL_CAPSULE | ORAL | Status: AC
  Administered 2019-10-31: 300 mg via ORAL

## 2019-10-31 MED ORDER — GABAPENTIN 300 MG PO CAPS
ORAL_CAPSULE | ORAL | Status: AC
  Filled 2019-10-31: qty 1

## 2019-10-31 MED ORDER — PROMETHAZINE HCL 25 MG/ML IJ SOLN: 6.2500 mg | INTRAMUSCULAR | Status: DC | PRN

## 2019-10-31 MED ORDER — OXYCODONE HCL 5 MG/5ML PO SOLN: 5.0000 mg | Freq: Once | ORAL | Status: DC | PRN

## 2019-10-31 MED ORDER — PROPOFOL 10 MG/ML IV BOLUS
INTRAVENOUS | Status: DC | PRN
  Administered 2019-10-31: 200 mg via INTRAVENOUS

## 2019-10-31 MED ORDER — FENTANYL CITRATE (PF) 100 MCG/2ML IJ SOLN: 25.0000 ug | INTRAMUSCULAR | Status: DC | PRN

## 2019-10-31 MED ORDER — CELECOXIB 200 MG PO CAPS
400.0000 mg | ORAL_CAPSULE | ORAL | Status: AC
  Administered 2019-10-31: 400 mg via ORAL

## 2019-10-31 SURGICAL SUPPLY — 48 items
ADH SKN CLS APL DERMABOND .7 (GAUZE/BANDAGES/DRESSINGS) ×1
APL PRP STRL LF DISP 70% ISPRP (MISCELLANEOUS) ×1
BLADE SURG 15 STRL LF DISP TIS (BLADE) ×1 IMPLANT
BLADE SURG 15 STRL SS (BLADE) ×3
CANISTER SUCT 1200ML W/VALVE (MISCELLANEOUS) IMPLANT
CHLORAPREP W/TINT 26 (MISCELLANEOUS) ×3 IMPLANT
CLIP VESOCCLUDE SM WIDE 6/CT (CLIP) IMPLANT
COVER BACK TABLE 60X90IN (DRAPES) ×3 IMPLANT
COVER MAYO STAND STRL (DRAPES) ×3 IMPLANT
COVER WAND RF STERILE (DRAPES) IMPLANT
DECANTER SPIKE VIAL GLASS SM (MISCELLANEOUS) ×2 IMPLANT
DERMABOND ADVANCED (GAUZE/BANDAGES/DRESSINGS) ×2
DERMABOND ADVANCED .7 DNX12 (GAUZE/BANDAGES/DRESSINGS) ×1 IMPLANT
DRAPE LAPAROSCOPIC ABDOMINAL (DRAPES) ×2 IMPLANT
DRAPE LAPAROTOMY 100X72 PEDS (DRAPES) ×1 IMPLANT
DRAPE UTILITY XL STRL (DRAPES) ×3 IMPLANT
ELECT REM PT RETURN 9FT ADLT (ELECTROSURGICAL) ×3
ELECTRODE REM PT RTRN 9FT ADLT (ELECTROSURGICAL) ×1 IMPLANT
GAUZE SPONGE 4X4 12PLY STRL LF (GAUZE/BANDAGES/DRESSINGS) ×3 IMPLANT
GLOVE BIO SURGEON STRL SZ 6.5 (GLOVE) ×2 IMPLANT
GLOVE BIO SURGEONS STRL SZ 6.5 (GLOVE) ×2
GLOVE BIOGEL PI IND STRL 6.5 (GLOVE) IMPLANT
GLOVE BIOGEL PI IND STRL 7.0 (GLOVE) IMPLANT
GLOVE BIOGEL PI INDICATOR 6.5 (GLOVE) ×2
GLOVE BIOGEL PI INDICATOR 7.0 (GLOVE) ×2
GLOVE SURG SIGNA 7.5 PF LTX (GLOVE) ×3 IMPLANT
GOWN STRL REUS W/ TWL LRG LVL3 (GOWN DISPOSABLE) ×1 IMPLANT
GOWN STRL REUS W/ TWL XL LVL3 (GOWN DISPOSABLE) ×1 IMPLANT
GOWN STRL REUS W/TWL LRG LVL3 (GOWN DISPOSABLE) ×6
GOWN STRL REUS W/TWL XL LVL3 (GOWN DISPOSABLE) ×3
KIT MARKER MARGIN INK (KITS) ×3 IMPLANT
NDL HYPO 25X1 1.5 SAFETY (NEEDLE) ×1 IMPLANT
NEEDLE HYPO 25X1 1.5 SAFETY (NEEDLE) ×3 IMPLANT
NS IRRIG 1000ML POUR BTL (IV SOLUTION) ×3 IMPLANT
PACK BASIN DAY SURGERY FS (CUSTOM PROCEDURE TRAY) ×3 IMPLANT
PENCIL SMOKE EVACUATOR (MISCELLANEOUS) ×3 IMPLANT
SLEEVE SCD COMPRESS KNEE MED (MISCELLANEOUS) ×2 IMPLANT
SPONGE LAP 4X18 RFD (DISPOSABLE) ×3 IMPLANT
SUT MNCRL AB 4-0 PS2 18 (SUTURE) ×3 IMPLANT
SUT SILK 2 0 SH (SUTURE) ×3 IMPLANT
SUT VIC AB 3-0 SH 27 (SUTURE) ×6
SUT VIC AB 3-0 SH 27X BRD (SUTURE) ×1 IMPLANT
SYR CONTROL 10ML LL (SYRINGE) ×3 IMPLANT
TOWEL GREEN STERILE FF (TOWEL DISPOSABLE) ×3 IMPLANT
TRAY FAXITRON CT DISP (TRAY / TRAY PROCEDURE) IMPLANT
TUBE CONNECTING 20'X1/4 (TUBING)
TUBE CONNECTING 20X1/4 (TUBING) IMPLANT
YANKAUER SUCT BULB TIP NO VENT (SUCTIONS) IMPLANT

## 2019-10-31 NOTE — Anesthesia Procedure Notes (Signed)
Procedure Name: LMA Insertion Date/Time: 10/31/2019 8:18 AM Performed by: Ronnette Hila, CRNA Pre-anesthesia Checklist: Patient identified, Emergency Drugs available, Suction available and Patient being monitored Patient Re-evaluated:Patient Re-evaluated prior to induction Oxygen Delivery Method: Circle system utilized Preoxygenation: Pre-oxygenation with 100% oxygen Induction Type: IV induction Ventilation: Mask ventilation without difficulty LMA: LMA inserted LMA Size: 5.0 Number of attempts: 1 Airway Equipment and Method: Bite block Placement Confirmation: positive ETCO2 Tube secured with: Tape Dental Injury: Teeth and Oropharynx as per pre-operative assessment

## 2019-10-31 NOTE — Anesthesia Postprocedure Evaluation (Signed)
Anesthesia Post Note  Patient: Lorraine [name]  Procedure(s) Performed: BILATERAL BREAST DUCTAL EXCISION (Bilateral Breast)     Patient location during evaluation: PACU Anesthesia Type: General Level of consciousness: awake and alert Pain management: pain level controlled Vital Signs Assessment: post-procedure vital signs reviewed and stable Respiratory status: spontaneous breathing, nonlabored ventilation, respiratory function stable and patient connected to nasal cannula oxygen Cardiovascular status: blood pressure returned to baseline and stable Postop Assessment: no apparent nausea or vomiting Anesthetic complications: no   No complications documented.  Last Vitals:  Vitals:   10/31/19 0945 10/31/19 1015  BP: 118/83 127/80  Pulse: (!) 51 62  Resp: (!) 0 16  Temp:  36.7 C  SpO2: 96% 100%    Last Pain:  Vitals:   10/31/19 1015  TempSrc:   PainSc: 0-No pain                 Gregrey Bloyd P Izadora Roehr

## 2019-10-31 NOTE — Transfer of Care (Signed)
Immediate Anesthesia Transfer of Care Note  Patient: Lorraine [name]  Procedure(s) Performed: BILATERAL BREAST DUCTAL EXCISION (Bilateral Breast)  Patient Location: PACU  Anesthesia Type:General  Level of Consciousness: awake, alert , oriented and patient cooperative  Airway & Oxygen Therapy: Patient Spontanous Breathing and Patient connected to face mask oxygen  Post-op Assessment: Report given to RN and Post -op Vital signs reviewed and stable  Post vital signs: Reviewed and stable  Last Vitals:  Vitals Value Taken Time  BP    Temp    Pulse 91 10/31/19 0907  Resp 17 10/31/19 0907  SpO2 94 % 10/31/19 0907  Vitals shown include unvalidated device data.  Last Pain:  Vitals:   10/31/19 0716  TempSrc: Oral  PainSc: 0-No pain         Complications: No complications documented.

## 2019-10-31 NOTE — Op Note (Signed)
BILATERAL BREAST DUCTAL EXCISION  Procedure Note  Lorraine [name] [facility] 10/31/2019   Pre-op Diagnosis: Right breast recurrent abscess and Left breast chronic nipple discharge     Post-op Diagnosis: same  Procedure(s): BILATERAL BREAST DUCTAL EXCISION  Surgeon(s): Abigail Miyamoto, MD Hedda Slade, PA-C  Anesthesia: General  Staff:  Circulator: Raliegh Scarlet, RN Scrub Person: Wardell Heath, CST  Estimated Blood Loss: Minimal               Specimens: sent to path  Indications: This is a 49 year old female who has had a previous excision of a chronic breast abscess in the right breast over a year ago.  She has had drainage from the incision as well as the nipple on that side.  She is also had purulent appearing drainage from her left nipple.  Mammograms and ultrasound have been unremarkable.  The decision was made to proceed with ductal excisions bilaterally   Procedure: The patient was brought to the operating room identified as the correct patient.  She is placed upon the operating table general anesthesia was induced.  Her breast were prepped and draped in usual sterile fashion.  I anesthetized the skin of the left breast at the lateral edge of the areola with Marcaine.  I then made incision with a scalpel.  I then dissected toward the nipple.  There was hard, firm chronically inflamed tissue just underneath the nipple which I excised with the cautery.  There were no large dilated ducts.  I then tied off the base of the nipple with a 3-0 Vicryl suture.  Specimen was sent to pathology for evaluation.  I palpated no other abnormalities.  We then closed the subcutaneous tissue with interrupted 3-0 Vicryl sutures and closed skin with running 4-0 Monocryl.  We next anesthetized the previous scar on the medial aspect of the nipple areolar complex of the right breast.  I then made elliptical incision with a scalpel excising the old scar and then the underlying scar tissue.  I then again  dissected toward the nipple excising the underlying tissue.  I found no purulence and no further abscess.  Some of the skin of the nipple was excised as well.  I then closed the small defect at the nipple with a 3-0 Vicryl suture, 4-0 Monocryl and Dermabond.  We then closed the breast incision with interrupted 3-0 Vicryl sutures and a running 4-0 Monocryl.  Dermabond was applied to all incisions.  The patient tolerated the procedure well.  All the counts were correct at the end of the procedure.  The patient was then extubated in the operating room and taken in a stable condition to the recovery room.          Abigail Miyamoto   Date: 10/31/2019  Time: 8:54 AM

## 2019-10-31 NOTE — Discharge Instructions (Signed)
[facility] Office Phone Number [phone]  BREAST BIOPSY/ LUMPECTOMY: POST OP INSTRUCTIONS  Always review your discharge instruction sheet given to you by the facility where your surgery was performed.  IF YOU HAVE DISABILITY OR FAMILY LEAVE FORMS, YOU MUST BRING THEM TO THE OFFICE FOR PROCESSING.  DO NOT GIVE THEM TO YOUR DOCTOR.  1. A prescription for pain medication may be given to you upon discharge.  Take your pain medication as prescribed, if needed.  If narcotic pain medicine is not needed, then you may take acetaminophen (Tylenol) or ibuprofen (Advil) as needed. No Tylenol or Ibuprofen until 2:00pm if needed. 2. Take your usually prescribed medications unless otherwise directed 3. If you need a refill on your pain medication, please contact your pharmacy.  They will contact our office to request authorization.  Prescriptions will not be filled after 5pm or on week-ends. 4. You should eat very light the first 24 hours after surgery, such as soup, crackers, pudding, etc.  Resume your normal diet the day after surgery. 5. Most patients will experience some swelling and bruising in the breast.  Ice packs and a good support bra will help.  Swelling and bruising can take several days to resolve.  6. It is common to experience some constipation if taking pain medication after surgery.  Increasing fluid intake and taking a stool softener will usually help or prevent this problem from occurring.  A mild laxative (Milk of Magnesia or Miralax) should be taken according to package directions if there are no bowel movements after 48 hours. 7. Unless discharge instructions indicate otherwise, you may remove your bandages 24-48 hours after surgery, and you may shower at that time.  You may have steri-strips (small skin tapes) in place directly over the incision.  These strips should be left on the skin for 7-10 days.  If your surgeon used skin glue on the incision, you may shower in 24  hours.  The glue will flake off over the next 2-3 [place].  Any sutures or staples will be removed at the office during your follow-up visit. 8. ACTIVITIES:  You may resume regular daily activities (gradually increasing) beginning the next day.  Wearing a good support bra or sports bra minimizes pain and swelling.  You may have sexual intercourse when it is comfortable. a. You may drive when you no longer are taking prescription pain medication, you can comfortably wear a seatbelt, and you can safely maneuver your car and apply brakes. b. RETURN TO WORK:  ______________________________________________________________________________________ 9. You should see your doctor in the office for a follow-up appointment approximately two [place] after your surgery.  Your doctor's nurse will typically make your follow-up appointment when she calls you with your pathology report.  Expect your pathology report 2-3 business days after your surgery.  You may call to check if you do not hear from [place] after three days. 10. OTHER INSTRUCTIONS:OK TO SHOWER STARTING TOMORROW 11. ICE PACK, TYLENOL, IBUPROFEN ALSO FOR PAIN 12. NO VIGOROUS ACTIVITY FOR ONE WEEK _______________________________________________________________________________________________ _____________________________________________________________________________________________________________________________________ _____________________________________________________________________________________________________________________________________ _____________________________________________________________________________________________________________________________________  WHEN TO CALL YOUR DOCTOR: 1. Fever over 101.0 2. Nausea and/or vomiting. 3. Extreme swelling or bruising. 4. Continued bleeding from incision. 5. Increased pain, redness, or drainage from the incision.  The clinic staff is available to answer your questions during regular  business hours.  Please don't hesitate to call and ask to speak to one of the nurses for clinical concerns.  If you have a medical emergency, go to the nearest emergency  room or call 911.  A surgeon from [facility] is always on call at the hospital.  For further questions, please visit [email or web address]    Post Anesthesia Home Care Instructions  Activity: Get plenty of rest for the remainder of the day. A responsible individual must stay with you for 24 hours following the procedure.  For the next 24 hours, DO NOT: -Drive a car -Advertising copywriter -Drink alcoholic beverages -Take any medication unless instructed by your physician -Make any legal decisions or sign important papers.  Meals: Start with liquid foods such as gelatin or soup. Progress to regular foods as tolerated. Avoid greasy, spicy, heavy foods. If nausea and/or vomiting occur, drink only clear liquids until the nausea and/or vomiting subsides. Call your physician if vomiting continues.  Special Instructions/Symptoms: Your throat may feel dry or sore from the anesthesia or the breathing tube placed in your throat during surgery. If this causes discomfort, gargle with warm salt water. The discomfort should disappear within 24 hours.  If you had a scopolamine patch placed behind your ear for the management of post- operative nausea and/or vomiting:  1. The medication in the patch is effective for 72 hours, after which it should be removed.  Wrap patch in a tissue and discard in the trash. Wash hands thoroughly with soap and water. 2. You may remove the patch earlier than 72 hours if you experience unpleasant side effects which may include dry mouth, dizziness or visual disturbances. 3. Avoid touching the patch. Wash your hands with soap and water after contact with the patch.

## 2019-10-31 NOTE — Anesthesia Preprocedure Evaluation (Addendum)
Anesthesia Evaluation  Patient identified by MRN, date of birth, ID band Patient awake    Reviewed: Allergy & Precautions, NPO status , Patient's Chart, lab work & pertinent test results  Airway Mallampati: II  TM Distance: >3 FB Neck ROM: Full    Dental no notable dental hx.    Pulmonary Current Smoker and Patient abstained from smoking.,    Pulmonary exam normal breath sounds clear to auscultation       Cardiovascular negative cardio ROS Normal cardiovascular exam Rhythm:Regular Rate:Normal     Neuro/Psych negative neurological ROS  negative psych ROS   GI/Hepatic negative GI ROS, Neg liver ROS,   Endo/Other  Morbid obesity  Renal/GU negative Renal ROS     Musculoskeletal Hidradenitis axillaris   Abdominal (+) + obese,   Peds  Hematology negative hematology ROS (+)   Anesthesia Other Findings breast abscess and chronic nipple discharge  Reproductive/Obstetrics                            Anesthesia Physical Anesthesia Plan  ASA: III  Anesthesia Plan: General   Post-op Pain Management:    Induction: Intravenous  PONV Risk Score and Plan: 2 and Ondansetron, Dexamethasone, Midazolam and Treatment may vary due to age or medical condition  Airway Management Planned: LMA  Additional Equipment:   Intra-op Plan:   Post-operative Plan: Extubation in OR  Informed Consent: I have reviewed the patients History and Physical, chart, labs and discussed the procedure including the risks, benefits and alternatives for the proposed anesthesia with the patient or authorized representative who has indicated his/her understanding and acceptance.     Dental advisory given  Plan Discussed with: CRNA  Anesthesia Plan Comments:        Anesthesia Quick Evaluation

## 2019-10-31 NOTE — Interval H&P Note (Signed)
History and Physical Interval Note:no change in H and P  10/31/2019 7:08 AM  Lorraine [name]  has presented today for surgery, with the diagnosis of breast abscess and chronic nipple discharge.  The various methods of treatment have been discussed with the patient and family. After consideration of risks, benefits and other options for treatment, the patient has consented to  Procedure(s) with comments: BILATERAL BREAST DUCTAL EXCISION (Bilateral) - LMA as a surgical intervention.  The patient's history has been reviewed, patient examined, no change in status, stable for surgery.  I have reviewed the patient's chart and labs.  Questions were answered to the patient's satisfaction.     Lorraine [name]

## 2019-11-01 ENCOUNTER — Encounter (HOSPITAL_BASED_OUTPATIENT_CLINIC_OR_DEPARTMENT_OTHER): Payer: Self-pay | Admitting: Surgery

## 2019-11-01 LAB — SURGICAL PATHOLOGY

## 2019-11-23 ENCOUNTER — Other Ambulatory Visit: Payer: 59

## 2020-04-10 IMAGING — MR MR ANKLE*R* WO/W CM
8 series · 40 of 40 positions shown · IV contrast (multihance)
Comparison: Radiographs 08/22/2017 and 08/31/2017.

CLINICAL DATA: Lateral ankle pain for 4 weeks. No acute injury.
Lucent lesions in the distal tibia and calcaneus on radiographs.

EXAM:
MRI OF THE RIGHT ANKLE WITHOUT AND WITH CONTRAST
TECHNIQUE: Multiplanar, multisequence MR imaging of the ankle was performed
before and after the administration of intravenous contrast.
CONTRAST:  20mL MULTIHANCE GADOBENATE DIMEGLUMINE 529 MG/ML IV SOLN

[Series 3: T2 fat-sat · axial · 3.0mm · 0.50mm/px · z∈[-66,+73]mm · 5 of 36 slices shown (1 of 2)]
[im 1/36]
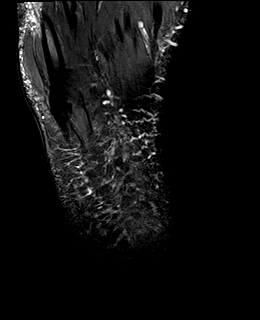
[im 9/36]
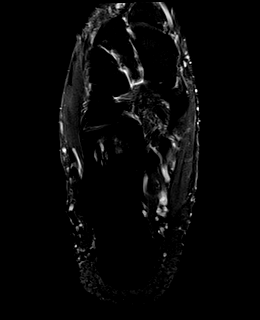
[im 18/36]
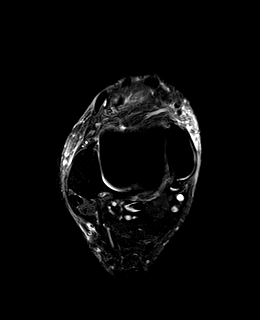
[im 27/36]
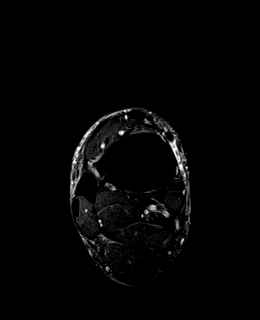
[im 36/36]
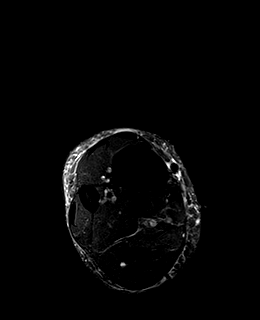

[Series 4: PD fat-sat · axial · 3.0mm · 0.47mm/px · z∈[-66,+73]mm · 5 of 36 slices shown]
[im 1/36]
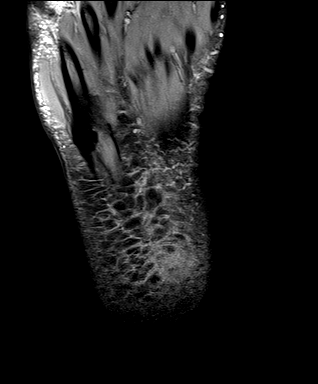
[im 9/36]
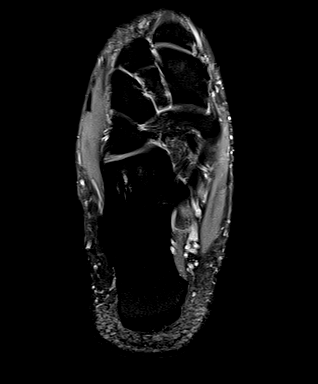
[im 18/36]
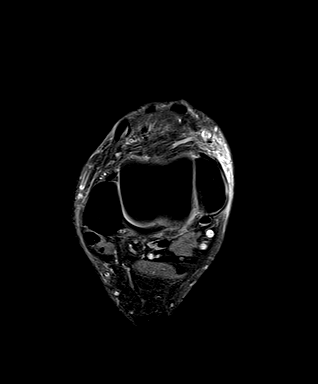
[im 27/36]
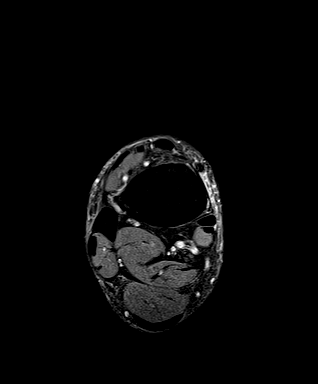
[im 36/36]
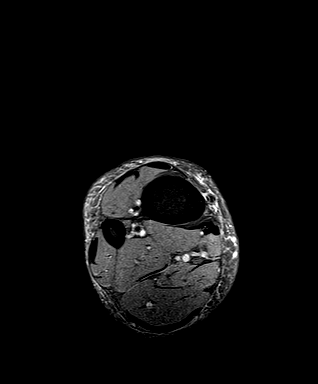

[Series 5: T2 fat-sat · coronal · 3.0mm · 0.62mm/px · 6 of 40 slices shown (2 of 2)]
[im 1/40]
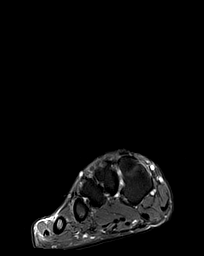
[im 8/40]
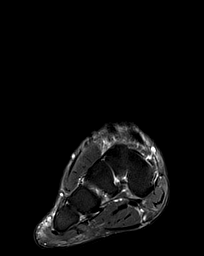
[im 16/40]
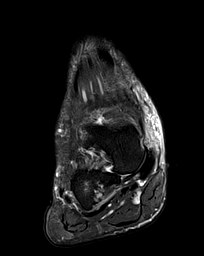
[im 24/40]
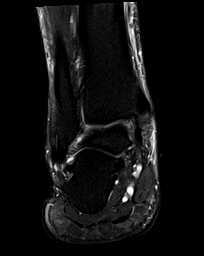
[im 32/40]
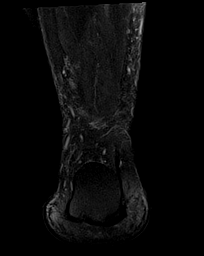
[im 40/40]
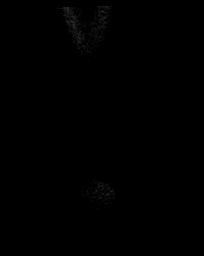

[Series 6: T1 · sagittal · 4.0mm · 0.56mm/px · 3 of 18 slices shown]
[im 1/18]
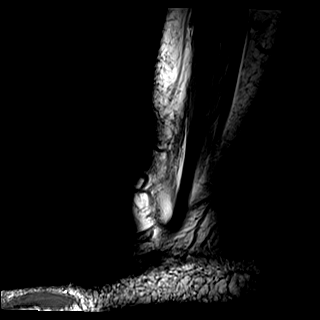
[im 9/18]
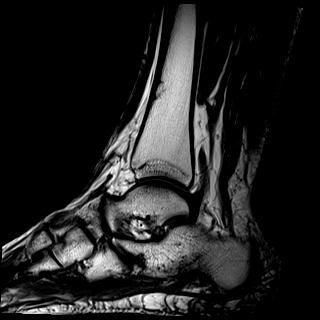
[im 18/18]
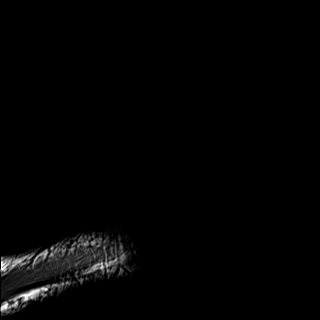

[Series 7: STIR · sagittal · 4.0mm · 0.70mm/px · 3 of 18 slices shown]
[im 1/18]
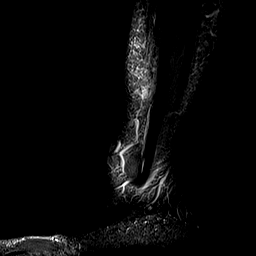
[im 9/18]
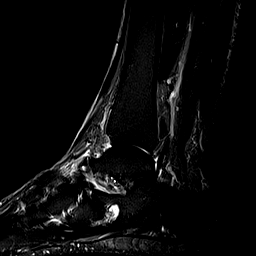
[im 18/18]
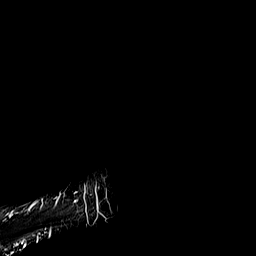

[Series 8: T1 fat-sat · axial · 3.0mm · 0.62mm/px · z∈[-66,+73]mm · 6 of 36 slices shown (1 of 3)]
[im 1/36]
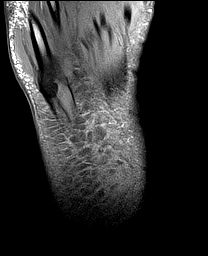
[im 8/36]
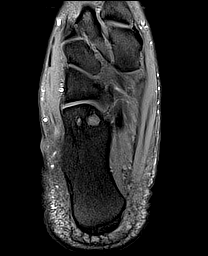
[im 15/36]
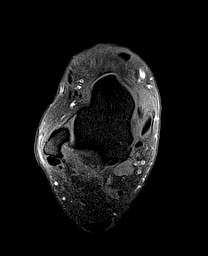
[im 22/36]
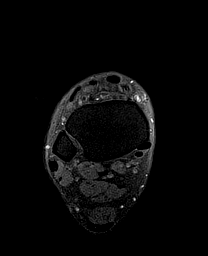
[im 29/36]
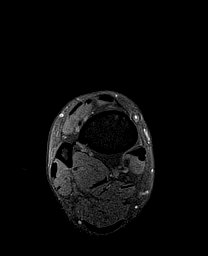
[im 36/36]
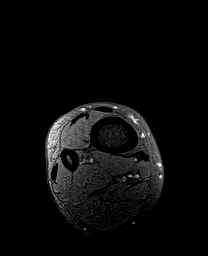

[Series 9: T1 fat-sat · axial · 3.0mm · 0.62mm/px · z∈[-66,+73]mm · 6 of 36 slices shown (2 of 3)]
[im 1/36]
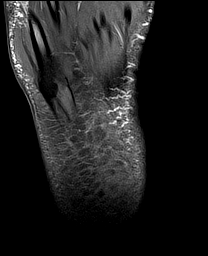
[im 8/36]
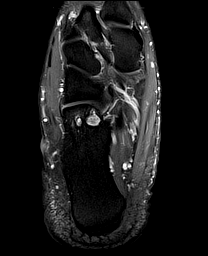
[im 15/36]
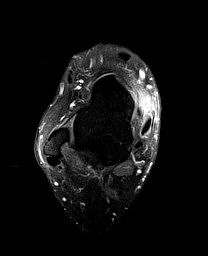
[im 22/36]
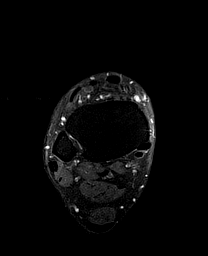
[im 29/36]
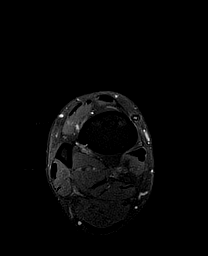
[im 36/36]
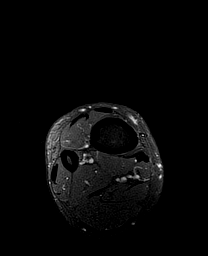

[Series 10: T1 fat-sat · coronal · 3.0mm · 0.62mm/px · 6 of 40 slices shown (3 of 3)]
[im 1/40]
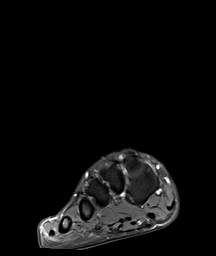
[im 8/40]
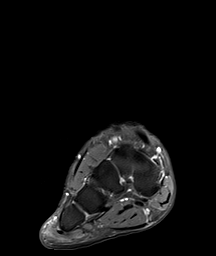
[im 16/40]
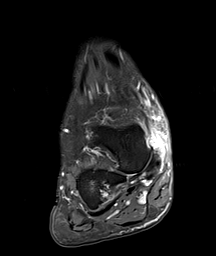
[im 24/40]
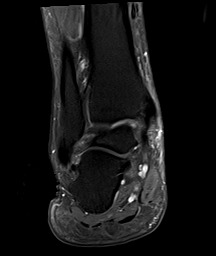
[im 32/40]
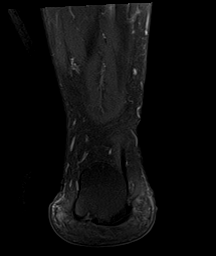
[im 40/40]
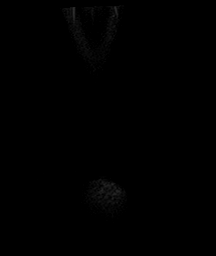

[40 of 40 positions shown; findings below may reference images not displayed]

FINDINGS: TENDONS

Peroneal: Intact and normally positioned. No significant
tenosynovitis. Peroneus quartus noted incidentally.

Posteromedial: Intact and normally positioned. Trace fluid in the
posterior tibialis tendon sheath.

Anterior: Intact and normally positioned.

Achilles: Intact.

Plantar Fascia: Intact.

LIGAMENTS

Lateral: The anterior and posterior talofibular and calcaneofibular
ligaments are intact. The calcaneofibular ligament is thickened.

Medial: The deltoid and visualized portions of the spring ligament
appear intact.

CARTILAGE AND BONES

Ankle Joint: No significant ankle joint effusion. The talar dome and
tibial plafond are intact.

Subtalar Joints/Sinus Tarsi: The subtalar joint appears normal.
There are small ganglia laterally in the tarsal sinus.

Bones: No acute osseous findings are demonstrated. There is a
well-circumscribed T2 hyperintense lesion anteriorly in the
calcaneus adjacent to the calcaneal cuboid articulation. This
measures 11 mm in greatest diameter and has sclerotic margins. There
are adjacent smaller T2 hyperintense lesions lateral to the dominant
component. There is some enhancement following contrast. The lucent
lesion noted in the distal tibia corresponds with anterior endosteal
sclerosis, best seen on the sagittal T1 weighted images. This has a
nonaggressive appearance.

Other: There is prominent soft tissue edema and enhancement anterior
and medial to the medial malleolus. No focal fluid collection
identified.
IMPRESSION: IMPRESSION
1. Apparent focal soft tissue inflammation anterior and distal to
the medial malleolus. No focal fluid collection or significant
abnormality of the posterior tibialis tendon demonstrated.
2. Intraosseous cystic lesions anteriorly in the calcaneus adjacent
to the calcaneal cuboid articulation. These have a nonaggressive
appearance, although do demonstrate some enhancement following
contrast. Subchondral cysts, intraosseous ganglia or atypical
vascular remnant favored.
3. Intact ankle tendons and ligaments.

## 2020-09-13 ENCOUNTER — Ambulatory Visit: Payer: 59 | Admitting: Family Medicine

## 2020-10-30 ENCOUNTER — Telehealth: Payer: Self-pay | Admitting: Family Medicine

## 2020-10-30 NOTE — Telephone Encounter (Signed)
Left message informing pt of Vickie's return to see if she wanted to reschedule.

## 2020-11-05 ENCOUNTER — Encounter: Payer: Self-pay | Admitting: Family Medicine

## 2020-12-28 ENCOUNTER — Other Ambulatory Visit: Payer: Self-pay

## 2020-12-28 ENCOUNTER — Encounter (HOSPITAL_BASED_OUTPATIENT_CLINIC_OR_DEPARTMENT_OTHER): Payer: Self-pay | Admitting: Nurse Practitioner

## 2020-12-28 ENCOUNTER — Telehealth (HOSPITAL_BASED_OUTPATIENT_CLINIC_OR_DEPARTMENT_OTHER): Payer: Self-pay

## 2020-12-28 ENCOUNTER — Other Ambulatory Visit (HOSPITAL_COMMUNITY)
Admission: RE | Admit: 2020-12-28 | Discharge: 2020-12-28 | Disposition: A | Discharge status: Home / Self Care | Payer: 59 | Source: Ambulatory Visit | Attending: Nurse Practitioner | Admitting: Nurse Practitioner

## 2020-12-28 ENCOUNTER — Ambulatory Visit (HOSPITAL_BASED_OUTPATIENT_CLINIC_OR_DEPARTMENT_OTHER): Payer: 59 | Admitting: Nurse Practitioner

## 2020-12-28 VITALS — BP 133/69 | HR 73 | Ht 68.0 in | Wt 256.6 lb

## 2020-12-28 DIAGNOSIS — Z0000 Encounter for general adult medical examination without abnormal findings: Secondary | ICD-10-CM | POA: Diagnosis not present

## 2020-12-28 DIAGNOSIS — R635 Abnormal weight gain: Secondary | ICD-10-CM

## 2020-12-28 DIAGNOSIS — N898 Other specified noninflammatory disorders of vagina: Secondary | ICD-10-CM | POA: Diagnosis present

## 2020-12-28 DIAGNOSIS — Z23 Encounter for immunization: Secondary | ICD-10-CM

## 2020-12-28 DIAGNOSIS — Z13220 Encounter for screening for lipoid disorders: Secondary | ICD-10-CM

## 2020-12-28 DIAGNOSIS — L732 Hidradenitis suppurativa: Secondary | ICD-10-CM

## 2020-12-28 DIAGNOSIS — Z130 Encounter for screening for diseases of the blood and blood-forming organs and certain disorders involving the immune mechanism: Secondary | ICD-10-CM | POA: Diagnosis not present

## 2020-12-28 DIAGNOSIS — Z720 Tobacco use: Secondary | ICD-10-CM

## 2020-12-28 DIAGNOSIS — Z13228 Encounter for screening for other metabolic disorders: Secondary | ICD-10-CM

## 2020-12-28 DIAGNOSIS — Z3049 Encounter for surveillance of other contraceptives: Secondary | ICD-10-CM | POA: Diagnosis not present

## 2020-12-28 DIAGNOSIS — Z1329 Encounter for screening for other suspected endocrine disorder: Secondary | ICD-10-CM

## 2020-12-28 DIAGNOSIS — Z1321 Encounter for screening for nutritional disorder: Secondary | ICD-10-CM

## 2020-12-28 MED ORDER — PHENTERMINE HCL 37.5 MG PO CAPS: ORAL_CAPSULE | ORAL | 0 refills | Status: DC

## 2020-12-28 MED ORDER — METRONIDAZOLE 500 MG PO TABS: 500.0000 mg | ORAL_TABLET | Freq: Two times a day (BID) | ORAL | 0 refills | Status: AC

## 2020-12-28 MED ORDER — PHEXXI 1.8-1-0.4 % VA GEL: VAGINAL | 11 refills | Status: DC

## 2020-12-28 NOTE — Assessment & Plan Note (Signed)
Working on diet and activity.  Will add phentermine to regimen to help boost metabolism to see if we can get her weight loss started.  Consider GLP-1 based on lab results  She will follow-up for weight

## 2020-12-28 NOTE — Assessment & Plan Note (Signed)
Review of current and past medical history, social history, medication, and family history.  Review of care gaps and health maintenance recommendations.  Records from recent providers to be requested if not available in Chart Review or Care Everywhere.  Recommendations for health maintenance, diet, and exercise provided.  Labs today: CPE labs HM Recommendations: flu, covid, colon ca CPE due: 1 year

## 2020-12-28 NOTE — Assessment & Plan Note (Signed)
Discussed safe options with patient.  Joint decision made to trial Phexxi suppository for use with intercourse Education on use provided. Prescription sent to pharmacy for patient. Coupon code provided We can send to mail order pharmacy if there is an issue with getting medication.

## 2020-12-28 NOTE — Assessment & Plan Note (Addendum)
Start phentermine today Consider GLP-1 in near future- awaiting labs to determine best medication option Monitor diet and increase activity No more than 1500 caloroies per day with less than 150g of carbohydrates for best results.  Will follow up in 12 [place]

## 2020-12-28 NOTE — Assessment & Plan Note (Signed)
Considering cessation.  Has chantix at home BF is planning to quit- this could be a great time to do this together.  Will counsel again at f/u

## 2020-12-28 NOTE — Progress Notes (Signed)
Tollie Eth, DNP, AGNP-c Primary Care & Sports Medicine [address]  [address] [place], [place] [address] [phone]  New patient visit   Patient: Lorraine [name]   DOB: 03-11-1970   50 y.o. Female  MRN: [id number] Visit Date: 12/28/2020  Patient Care Team: Natividad Halls, Sung Amabile, NP as PCP - General (Nurse Practitioner)  Today's healthcare provider: Tollie Eth, NP   Chief Complaint  Patient presents with   Establish Care    Patient states she has symptoms of bv, but only when she has her period. Patient would like to discuss weight loss.   Subjective    Lorraine [name] is a 50 y.o. female who presents today as a new patient to establish care.  HPI  BV Endorses symptoms associated with BV while on her period for about the past 3 months.  She has not had any new sexual partners. She is in a mutually monogamous relationship.  She has had BV in the past and reports the odor and discharge are the same.  Endorses strong "fish-like" odor and vaginal irritation.  Symptoms resolve when menses ends.   Weight Loss She has been on phentermine in the past and had success.  She has gained weight recently and would like to try this again to help "jump start" her metabolism to help with weight loss.   Breast abscess Endorses history of multiple abscessed areas to breast and axillary region.  Had several surgical procedures on the breast for drainage with no growth on culture.  Determined to be hidradenitis suppurativa.  She endorses monitoring closely for new places.  She does have some drainage from the left breast intermittently, but nothing continuous.   She would also like to discuss birth control options that may be safe for her to take given her age and being a smoker.   Past Medical History:  Diagnosis Date   Allergy    Seasonal   Seasonal allergies    SVD (spontaneous vaginal delivery)    x 1   Past Surgical History:  Procedure Laterality  Date   BREAST DUCTAL SYSTEM EXCISION Bilateral 10/31/2019   Procedure: BILATERAL BREAST DUCTAL EXCISION;  Surgeon: Abigail Miyamoto, MD;  Location: [facility];  Service: General;  Laterality: Bilateral;  LMA   CHOLECYSTECTOMY     MASS EXCISION Right 08/19/2018   Procedure: EXCISION CHRONIC RIGHT BREAST ABSCESS;  Surgeon: Abigail Miyamoto, MD;  Location: [facility];  Service: General;  Laterality: Right;   PILONIDAL CYST EXCISION     WISDOM TOOTH EXTRACTION     Family Status  Relation Name Status   Father  Deceased   Mother  Alive   Sister  Alive   Brother  Alive   Oceanographer  (Not Specified)   Oneal Grout  (Not Specified)   MGM  (Not Specified)   Daughter  Alive   Family History  Problem Relation Age of Onset   Heart disease Father    Diabetes Father    Thyroid disease Mother        Pituitary tumor   Kidney disease Mother        renal tumor- benign   Thyroid disease Sister    Diabetes Paternal Aunt    Diabetes Paternal Uncle    Diabetes Maternal Grandmother    Social History   Socioeconomic History   Marital status: Significant Other    Spouse name: Not on file   Number of children: Not on file  Years of education: Not on file   Highest education level: Not on file  Occupational History   Not on file  Tobacco Use   Smoking status: Every Day    Packs/day: 0.50    Years: 24.00    Pack years: 12.00    Types: Cigarettes   Smokeless tobacco: Never  Vaping Use   Vaping Use: Never used  Substance and Sexual Activity   Alcohol use: Yes    Alcohol/week: 1.0 standard drink    Types: 1 Glasses of wine per week    Comment: SOCIALLY (TWICE A MONTH)   Drug use: No   Sexual activity: Yes    Birth control/protection: Condom  Other Topics Concern   Not on file  Social History Narrative   Not on file   Social Determinants of Health   Financial Resource Strain: Not on file  Food Insecurity: Not on file  Transportation Needs: Not on file  Physical Activity:  Not on file  Stress: Not on file  Social Connections: Not on file   Outpatient Medications Prior to Visit  Medication Sig   [DISCONTINUED] medroxyPROGESTERone Acetate 150 MG/ML SUSY Inject 1 mL (150 mg total) into the muscle every 3 (three) months.   [DISCONTINUED] phentermine (ADIPEX-P) 37.5 MG tablet Take 1 tablet (37.5 mg total) by mouth daily before breakfast.   [DISCONTINUED] traMADol (ULTRAM) 50 MG tablet Take 1 tablet (50 mg total) by mouth every 6 (six) hours as needed for moderate pain.   No facility-administered medications prior to visit.   Allergies  Allergen Reactions   Penicillins Other (See Comments)    Did it involve swelling of the face/tongue/throat, SOB, or low BP? Unknown Did it involve sudden or severe rash/hives, skin peeling, or any reaction on the inside of your mouth or nose? Unknown Did you need to seek medical attention at a hospital or doctor's office? Unknown When did it last happen?childhood reaction If all above answers are "NO", may proceed with cephalosporin use.     Immunization History  Administered Date(s) Administered   Influenza,inj,Quad PF,6+ Mos 12/22/2016, 01/26/2018, 11/10/2018   PFIZER(Purple Top)SARS-COV-2 Vaccination 03/25/2019, 04/25/2019   Pneumococcal Polysaccharide-23 10/29/2015   Tdap 11/10/2018    Health Maintenance  Topic Date Due   Hepatitis C Screening  Never done   Pneumococcal Vaccine 53-17 Years old (2 - PCV) 10/28/2016   COLONOSCOPY (Pts 45-103yrs Insurance coverage will need to be confirmed)  Never done   COVID-19 Vaccine (3 - Pfizer risk series) 05/23/2019   INFLUENZA VACCINE  10/08/2020   PAP SMEAR-Modifier  11/09/2021   TETANUS/TDAP  11/09/2028   HIV Screening  Completed   HPV VACCINES  Aged Out    Patient Care Team: Rashay Barnette, Sung Amabile, NP as PCP - General (Nurse Practitioner)  Review of Systems All review of systems negative except what is listed in the HPI    Objective    BP 133/69   Pulse 73   Ht 5\' 8"   (1.727 m)   Wt 256 lb 9.6 oz (116.4 kg)   SpO2 100%   BMI 39.02 kg/m  Physical Exam Vitals and nursing note reviewed.  Constitutional:      General: She is not in acute distress.    Appearance: Normal appearance.  HENT:     Head: Normocephalic.  Eyes:     Extraocular Movements: Extraocular movements intact.     Conjunctiva/sclera: Conjunctivae normal.     Pupils: Pupils are equal, round, and reactive to light.  Neck:  Vascular: No carotid bruit.  Cardiovascular:     Rate and Rhythm: Normal rate and regular rhythm.     Pulses: Normal pulses.     Heart sounds: Normal heart sounds. No murmur heard. Pulmonary:     Effort: Pulmonary effort is normal. No respiratory distress.     Breath sounds: Normal breath sounds.  Abdominal:     General: Bowel sounds are normal.     Palpations: Abdomen is soft.  Musculoskeletal:        General: Normal range of motion.     Cervical back: Normal range of motion. No tenderness.     Right lower leg: No edema.     Left lower leg: No edema.  Lymphadenopathy:     Cervical: No cervical adenopathy.  Skin:    General: Skin is warm and dry.     Capillary Refill: Capillary refill takes less than 2 seconds.  Neurological:     General: No focal deficit present.     Mental Status: She is alert and oriented to person, place, and time.  Psychiatric:        Mood and Affect: Mood normal.        Behavior: Behavior normal.        Thought Content: Thought content normal.        Judgment: Judgment normal.   Depression Screen PHQ 2/9 Scores 12/28/2020 06/29/2019 11/10/2018 07/20/2018  PHQ - 2 Score 0 0 0 0  PHQ- 9 Score 0 - 0 -   No results found for any visits on 12/28/20.  Assessment & Plan      Problem List Items Addressed This Visit     Hidradenitis suppurativa    Symptoms and presentation consistent with HS Recommend monitoring for new occurrences.  We will try spironolactone and doxycycline if outbreak occurs again to see if we can get under  control and prevent additional formation.  Patient will f/u if new areas form.       Relevant Medications   metroNIDAZOLE (FLAGYL) 500 MG tablet   Class 3 obesity (HCC)    Working on diet and activity.  Will add phentermine to regimen to help boost metabolism to see if we can get her weight loss started.  Consider GLP-1 based on lab results  She will follow-up for weight      Tobacco user    Considering cessation.  Has chantix at home BF is planning to quit- this could be a great time to do this together.  Will counsel again at f/u      Contraceptive management    Discussed safe options with patient.  Joint decision made to trial Phexxi suppository for use with intercourse Education on use provided. Prescription sent to pharmacy for patient. Coupon code provided We can send to mail order pharmacy if there is an issue with getting medication.       Relevant Medications   Lactic Ac-Citric Ac-Pot Bitart (PHEXXI) 1.8-1-0.4 % GEL   Encounter for medical examination to establish care - Primary    Review of current and past medical history, social history, medication, and family history.  Review of care gaps and health maintenance recommendations.  Records from recent providers to be requested if not available in Chart Review or Care Everywhere.  Recommendations for health maintenance, diet, and exercise provided.  Labs today: CPE labs HM Recommendations: flu, covid, colon ca CPE due: 1 year       Weight gain    Start phentermine today Consider GLP-1  in near future- awaiting labs to determine best medication option Monitor diet and increase activity No more than 1500 caloroies per day with less than 150g of carbohydrates for best results.  Will follow up in 4 [place]      Relevant Medications   phentermine 37.5 MG capsule   phentermine 37.5 MG capsule (Start on 01/25/2021)   phentermine 37.5 MG capsule (Start on 02/22/2021)   Vaginal odor    Symptoms consistent with  BV. Will obtain vaginal swab today Script provided for patient so that she can pick this up if results come back over the weekend.  If results negative, will consider vaginal examination for evaluation.       Relevant Medications   metroNIDAZOLE (FLAGYL) 500 MG tablet   Other Relevant Orders   Cervicovaginal ancillary only   Other Visit Diagnoses     Screening for deficiency anemia       Relevant Orders   CBC With Differential   Screening for lipid disorders       Relevant Orders   Lipid panel   Screening for endocrine, nutritional, metabolic and immunity disorder       Relevant Orders   Comprehensive metabolic panel   TSH   VITAMIN D 25 Hydroxy (Vit-D Deficiency, Fractures)        Return in about 3 months (around 03/30/2021) for Phone visit- f/u weight.      Avaiah Stempel, Sung Amabile, NP, DNP, AGNP-C Primary Care & Sports Medicine at [facility]

## 2020-12-28 NOTE — Patient Instructions (Signed)
Recommendations from today's visit: I will let you know the results of your labs when they come in We have sent the phentermine to the pharmacy. We will follow up in 3 months with a phone visit to see how this is working for you.  I have sent treatment for BV to the pharmacy. You can wait to pick this up until your results are back if you would like.  Let me know if you have any concerns or questions.   Information on diet, exercise, and health maintenance recommendations are listed below. This is information to help you be sure you are on track for optimal health and monitoring.   Please look over this and let us know if you have any questions or if you have completed any of the health maintenance outside of [place] so that we can be sure your records are up to date.  ___________________________________________________________  Thank you for choosing [place] at [facility] for your Primary Care needs. I am excited for the opportunity to partner with you to meet your health care goals. It was a pleasure meeting you today!  I am an Adult-Geriatric Nurse Practitioner with a background in caring for patients for more than 20 years. I provide primary care and sports medicine services to patients age 40 and older within this office. I am also the director of the APP Fellowship with [facility].   I am passionate about providing the best service to you through preventive medicine and supportive care. I consider you a part of the medical team and value your input. I work diligently to ensure that you are heard and your needs are met in a safe and effective manner. I want you to feel comfortable with me as your provider and want you to know that your health concerns are important to me.  For your information, our office hours are Monday- Friday 8:00 AM - 5:00 PM At this time I am not in the office on Wednesdays.  If you have questions or concerns, please call our office at  [phone] or send [place] a MyChart message and we will respond as quickly as possible.   For all urgent or time sensitive needs we ask that you please call the office to avoid delays. MyChart is not constantly monitored and replies may take up to 72 business hours.  MyChart Policy: MyChart allows for you to see your visit notes, after visit summary, provider recommendations, lab and tests results, make an appointment, request refills, and contact your provider or the office for non-urgent questions or concerns. Providers are seeing patients during normal business hours and do not have built in time to review MyChart messages.  We ask that you allow a minimum of 4 business days for responses to [employer]. For this reason, please do not send urgent requests through [place]. Please call the office at [phone]. Complex MyChart concerns may require a visit. Your provider may request you schedule a virtual or in person visit to ensure we are providing the best care possible. MyChart messages sent after 4:00 PM on Friday will not be received by the provider until Monday morning.    Lab and Test Results: You will receive your lab and test results on MyChart as soon as they are completed and results have been sent by the lab or testing facility. Due to this service, you will receive your results BEFORE your provider.  I review lab and tests results each morning prior to seeing  patients. Some results require collaboration with other providers to ensure you are receiving the most appropriate care. For this reason, we ask that you please allow a minimum of 4 business days for your provider to receive and review lab and test results and contact you about these.  Most lab and test result comments from the provider will be sent through [place]. Your provider may recommend changes to the plan of care, follow-up visits, repeat testing, ask questions, or request an office visit to discuss these results. You may  reply directly to this message or call the office at [phone] to provide information for the provider or set up an appointment. In some instances, you will be called with test results and recommendations. Please let us know if this is preferred and we will make note of this in your chart to provide this for you.    If you have not heard a response to your lab or test results in 72 business hours, please call the office to let us know.   After Hours: For all non-emergency after hours needs, please call the office at [phone] and select the option to reach the on-call provider service. On-call services are shared between multiple [place] offices and therefore it will not be possible to speak directly with your provider. On-call providers may provide medical advice and recommendations, but are unable to provide refills for maintenance medications.  For all emergency or urgent medical needs after normal business hours, we recommend that you seek care at the closest Urgent Care or Emergency Department to ensure appropriate treatment in a timely manner.  [facility] [place] has a 24 hour emergency room located on the ground floor for your convenience.    Please do not hesitate to reach out to [place] with concerns.   Thank you, again, for choosing me as your health care partner. I appreciate your trust and look forward to learning more about you.   Worthy Keeler, DNP, AGNP-c ___________________________________________________________  Health Maintenance Recommendations Screening Testing Mammogram Every 1 -2 years based on history and risk factors Starting at age 50 Pap Smear Ages 21-39 every 3 years Ages 29-65 every 5 years with HPV testing More frequent testing may be required based on results and history Colon Cancer Screening Every 1-10 years based on test performed, risk factors, and history Starting at age 62 Bone Density Screening Every 2-10 years based on  history Starting at age 76 for women Recommendations for men differ based on medication usage, history, and risk factors AAA Screening One time ultrasound Men 52-23 years old who have every smoked Lung Cancer Screening Low Dose Lung CT every 12 months Age 62-80 years with a 30 pack-year smoking history who still smoke or who have quit within the last 15 years  Screening Labs Routine  Labs: Complete Blood Count (CBC), Complete Metabolic Panel (CMP), Cholesterol (Lipid Panel) Every 6-12 months based on history and medications May be recommended more frequently based on current conditions or previous results Hemoglobin A1c Lab Every 3-12 months based on history and previous results Starting at age 36 or earlier with diagnosis of diabetes, high cholesterol, BMI >26, and/or risk factors Frequent monitoring for patients with diabetes to ensure blood sugar control Thyroid Panel (TSH w/ T3 & T4) Every 6 months based on history, symptoms, and risk factors May be repeated more often if on medication HIV One time testing for all patients 79 and older May be repeated more frequently for patients with increased risk factors  or exposure Hepatitis C One time testing for all patients 11 and older May be repeated more frequently for patients with increased risk factors or exposure Gonorrhea, Chlamydia Every 12 months for all sexually active persons 13-24 years Additional monitoring may be recommended for those who are considered high risk or who have symptoms PSA Men 35-99 years old with risk factors Additional screening may be recommended from age 55-69 based on risk factors, symptoms, and history  Vaccine Recommendations Tetanus Booster All adults every 10 years Flu Vaccine All patients 6 months and older every year COVID Vaccine All patients 12 years and older Initial dosing with booster May recommend additional booster based on age and health history HPV Vaccine 2 doses all patients  age 83-26 Dosing may be considered for patients over 26 Shingles Vaccine (Shingrix) 2 doses all adults 21 years and older Pneumonia (Pneumovax 23) All adults 38 years and older May recommend earlier dosing based on health history Pneumonia (Prevnar 91) All adults 14 years and older Dosed 1 year after Pneumovax 23  Additional Screening, Testing, and Vaccinations may be recommended on an individualized basis based on family history, health history, risk factors, and/or exposure.  __________________________________________________________  Diet Recommendations for All Patients  I recommend that all patients maintain a diet low in saturated fats, carbohydrates, and cholesterol. While this can be challenging at first, it is not impossible and small changes can make big differences.  Things to try: Decreasing the amount of soda, sweet tea, and/or juice to one or less per day and replace with water While water is always the first choice, if you do not like water you may consider adding a water additive without sugar to improve the taste other sugar free drinks Replace potatoes with a brightly colored vegetable at dinner Use healthy oils, such as canola oil or olive oil, instead of butter or hard margarine Limit your bread intake to two pieces or less a day Replace regular pasta with low carb pasta options Bake, broil, or grill foods instead of frying Monitor portion sizes  Eat smaller, more frequent meals throughout the day instead of large meals  An important thing to remember is, if you love foods that are not great for your health, you don't have to give them up completely. Instead, allow these foods to be a reward when you have done well. Allowing yourself to still have special treats every once in a while is a nice way to tell yourself thank you for working hard to keep yourself healthy.   Also remember that every day is a new day. If you have a bad day and "fall off the wagon", you can  still climb right back up and keep moving along on your journey!  We have resources available to help you!  Some websites that may be helpful include: www.http://carter.biz/  Www.VeryWellFit.com _____________________________________________________________  Activity Recommendations for All Patients  I recommend that all adults get at least 20 minutes of moderate physical activity that elevates your heart rate at least 5 days out of the week.  Some examples include: Walking or jogging at a pace that allows you to carry on a conversation Cycling (stationary bike or outdoors) Water aerobics Yoga Weight lifting Dancing If physical limitations prevent you from putting stress on your joints, exercise in a pool or seated in a chair are excellent options.  Do determine your MAXIMUM heart rate for activity: YOUR AGE - 220 = MAX HeartRate   Remember! Do not push yourself too hard.  Start slowly and build up your pace, speed, weight, time in exercise, etc.  Allow your body to rest between exercise and get good sleep. You will need more water than normal when you are exerting yourself. Do not wait until you are thirsty to drink. Drink with a purpose of getting in at least 8, 8 ounce glasses of water a day plus more depending on how much you exercise and sweat.    If you begin to develop dizziness, chest pain, abdominal pain, jaw pain, shortness of breath, headache, vision changes, lightheadedness, or other concerning symptoms, stop the activity and allow your body to rest. If your symptoms are severe, seek emergency evaluation immediately. If your symptoms are concerning, but not severe, please let us know so that we can recommend further evaluation.   ________________________________________________________________

## 2020-12-28 NOTE — Assessment & Plan Note (Signed)
Symptoms consistent with BV. Will obtain vaginal swab today Script provided for patient so that she can pick this up if results come back over the weekend.  If results negative, will consider vaginal examination for evaluation.

## 2020-12-28 NOTE — Telephone Encounter (Signed)
Error

## 2020-12-28 NOTE — Assessment & Plan Note (Signed)
Symptoms and presentation consistent with HS Recommend monitoring for new occurrences.  We will try spironolactone and doxycycline if outbreak occurs again to see if we can get under control and prevent additional formation.  Patient will f/u if new areas form.

## 2020-12-29 LAB — CBC WITH DIFFERENTIAL
Basophils Absolute: 0 10*3/uL (ref 0.0–0.2)
Basos: 1 %
EOS (ABSOLUTE): 0.3 10*3/uL (ref 0.0–0.4)
Eos: 4 %
Hematocrit: 44.1 % (ref 34.0–46.6)
Hemoglobin: 14.8 g/dL (ref 11.1–15.9)
Immature Grans (Abs): 0 10*3/uL (ref 0.0–0.1)
Immature Granulocytes: 0 %
Lymphocytes Absolute: 3.6 10*3/uL — ABNORMAL HIGH (ref 0.7–3.1)
Lymphs: 54 %
MCH: 30.5 pg (ref 26.6–33.0)
MCHC: 33.6 g/dL (ref 31.5–35.7)
MCV: 91 fL (ref 79–97)
Monocytes Absolute: 0.4 10*3/uL (ref 0.1–0.9)
Monocytes: 5 %
Neutrophils Absolute: 2.3 10*3/uL (ref 1.4–7.0)
Neutrophils: 36 %
RBC: 4.85 x10E6/uL (ref 3.77–5.28)
RDW: 11.6 % — ABNORMAL LOW (ref 11.7–15.4)
WBC: 6.6 10*3/uL (ref 3.4–10.8)

## 2020-12-29 LAB — COMPREHENSIVE METABOLIC PANEL
ALT: 12 IU/L (ref 0–32)
AST: 14 IU/L (ref 0–40)
Albumin/Globulin Ratio: 1.6 (ref 1.2–2.2)
Albumin: 4.3 g/dL (ref 3.8–4.8)
Alkaline Phosphatase: 59 IU/L (ref 44–121)
BUN/Creatinine Ratio: 10 (ref 9–23)
BUN: 8 mg/dL (ref 6–24)
Bilirubin Total: 0.7 mg/dL (ref 0.0–1.2)
CO2: 25 mmol/L (ref 20–29)
Calcium: 9.6 mg/dL (ref 8.7–10.2)
Chloride: 100 mmol/L (ref 96–106)
Creatinine, Ser: 0.79 mg/dL (ref 0.57–1.00)
Globulin, Total: 2.7 g/dL (ref 1.5–4.5)
Glucose: 81 mg/dL (ref 70–99)
Potassium: 4 mmol/L (ref 3.5–5.2)
Sodium: 138 mmol/L (ref 134–144)
Total Protein: 7 g/dL (ref 6.0–8.5)
eGFR: 93 mL/min/{1.73_m2} (ref >59)

## 2020-12-29 LAB — LIPID PANEL
Chol/HDL Ratio: 2.6 ratio (ref 0.0–4.4)
Cholesterol, Total: 168 mg/dL (ref 100–199)
HDL: 64 mg/dL (ref >39)
LDL Chol Calc (NIH): 91 mg/dL (ref 0–99)
Triglycerides: 68 mg/dL (ref 0–149)
VLDL Cholesterol Cal: 13 mg/dL (ref 5–40)

## 2020-12-29 LAB — VITAMIN D 25 HYDROXY (VIT D DEFICIENCY, FRACTURES): Vit D, 25-Hydroxy: 7.6 ng/mL — ABNORMAL LOW (ref 30.0–100.0)

## 2020-12-29 LAB — TSH: TSH: 1 u[IU]/mL (ref 0.450–4.500)

## 2021-01-01 LAB — CERVICOVAGINAL ANCILLARY ONLY
Bacterial Vaginitis (gardnerella): POSITIVE — AB
Candida Glabrata: NEGATIVE
Candida Vaginitis: NEGATIVE
Chlamydia: NEGATIVE
Comment: NEGATIVE
Comment: NEGATIVE
Comment: NEGATIVE
Comment: NEGATIVE
Comment: NEGATIVE
Comment: NORMAL
Neisseria Gonorrhea: NEGATIVE
Trichomonas: NEGATIVE

## 2021-01-02 ENCOUNTER — Telehealth (HOSPITAL_BASED_OUTPATIENT_CLINIC_OR_DEPARTMENT_OTHER): Payer: Self-pay

## 2021-01-02 DIAGNOSIS — Z716 Tobacco abuse counseling: Secondary | ICD-10-CM

## 2021-01-02 MED ORDER — CHANTIX STARTING MONTH PAK 0.5 MG X 11 & 1 MG X 42 PO TBPK: ORAL_TABLET | ORAL | 0 refills | Status: AC

## 2021-01-02 MED ORDER — VITAMIN D (ERGOCALCIFEROL) 1.25 MG (50000 UNIT) PO CAPS: ORAL_CAPSULE | ORAL | 0 refills | Status: DC

## 2021-01-02 MED ORDER — CHANTIX STARTING MONTH PAK 0.5 MG X 11 & 1 MG X 42 PO TBPK: ORAL_TABLET | ORAL | 0 refills | Status: DC

## 2021-01-02 NOTE — Telephone Encounter (Signed)
-----   Message from Tollie Eth, NP sent at 12/31/2020  6:45 PM EDT ----- Please call patient:  All labs look great with exception of Vitamin D. This level is very low. This can affect your bone and your muscles.  I would like you to start Vitamin D supplement of 50,000iU twice a week for the next 8 [place] to see if we can get this improve.   Please schedule lab f/u in 8 [place].  OK to send Vitamin D3 for vitamin d deficiency

## 2021-01-02 NOTE — Telephone Encounter (Signed)
OK to start Chantix.  Sent this and vitamin D in for her.

## 2021-01-02 NOTE — Telephone Encounter (Signed)
Patient is aware and agreeable to labs results and recommendations Patient is agreeable to vit d supplementation and recheck in 8 [place] Will send RX in Patient inquired about getting a prescription for Chantix as she expressed she wants to stop smoking.  She states he boyfriend recently stared Chantix and she feels it would be beneficial for them to quit together I will forward to [place] for advisement

## 2021-01-03 ENCOUNTER — Telehealth (HOSPITAL_BASED_OUTPATIENT_CLINIC_OR_DEPARTMENT_OTHER): Payer: Self-pay

## 2021-01-03 NOTE — Telephone Encounter (Signed)
Patient left a voicemail stating she discussed smoking cessation with you at her last appointment.  She wants to give Chantix a try.

## 2021-01-03 NOTE — Telephone Encounter (Signed)
Called patient to inform her that the Chantix has been sent to her pharmacy.

## 2021-03-25 ENCOUNTER — Other Ambulatory Visit (HOSPITAL_BASED_OUTPATIENT_CLINIC_OR_DEPARTMENT_OTHER): Payer: Self-pay | Admitting: Nurse Practitioner

## 2021-03-25 DIAGNOSIS — R635 Abnormal weight gain: Secondary | ICD-10-CM

## 2021-03-29 ENCOUNTER — Ambulatory Visit (INDEPENDENT_AMBULATORY_CARE_PROVIDER_SITE_OTHER): Payer: 59 | Admitting: Nurse Practitioner

## 2021-03-29 ENCOUNTER — Other Ambulatory Visit: Payer: Self-pay

## 2021-03-29 ENCOUNTER — Encounter (HOSPITAL_BASED_OUTPATIENT_CLINIC_OR_DEPARTMENT_OTHER): Payer: Self-pay | Admitting: Nurse Practitioner

## 2021-03-29 DIAGNOSIS — Z1211 Encounter for screening for malignant neoplasm of colon: Secondary | ICD-10-CM

## 2021-03-29 DIAGNOSIS — Z716 Tobacco abuse counseling: Secondary | ICD-10-CM | POA: Diagnosis not present

## 2021-03-29 DIAGNOSIS — Z720 Tobacco use: Secondary | ICD-10-CM

## 2021-03-29 DIAGNOSIS — Z1231 Encounter for screening mammogram for malignant neoplasm of breast: Secondary | ICD-10-CM | POA: Diagnosis not present

## 2021-03-29 MED ORDER — NICOTINE 7 MG/24HR TD PT24: 7.0000 mg | MEDICATED_PATCH | Freq: Every day | TRANSDERMAL | 2 refills | Status: DC

## 2021-03-29 MED ORDER — BUPROPION HCL ER (SR) 150 MG PO TB12: ORAL_TABLET | ORAL | 6 refills | Status: DC

## 2021-03-29 MED ORDER — NICOTINE 14 MG/24HR TD PT24: 14.0000 mg | MEDICATED_PATCH | Freq: Every day | TRANSDERMAL | 0 refills | Status: DC

## 2021-03-29 NOTE — Progress Notes (Signed)
Virtual Visit Encounter  telephone visit.   I connected with  Lorraine [name] on 03/29/21 at  9:50 AM EST by secure audio and/or video enabled telemedicine application. I verified that I am speaking with the correct person using two identifiers.   I introduced myself as a Publishing rights manager with the practice. The limitations of evaluation and management by telemedicine discussed with the patient and the availability of in person appointments. The patient expressed verbal understanding and consent to proceed.  Participating parties in this visit include: Myself and patient  The patient is: Patient Location: Home I am: Provider Location: Office/Clinic Subjective:    CC and HPI: Lorraine [name] is a 51 y.o. year old female presenting for follow up of weight and smoking.  She reports she has been taking the chantix as prescribed but has not noticed any changes in cravings or desire for smoking. She would like to discuss alternative options. She is currently smoking about 1/4-1/2 PPD.  She reports she is doing very well on the phentermine. She has lost down to 240lbs and is feeling very good. No SE from medication. She is sleeping well.  She is monitoring her diet and increasing activity.    Past medical history, Surgical history, Family history not pertinant except as noted below, Social history, Allergies, and medications have been entered into the medical record, reviewed, and corrections made.   Review of Systems:  All review of systems negative except what is listed in the HPI  Objective:    Alert and oriented x 4  Speaking in clear sentences with no shortness of breath. No distress.  Impression and Recommendations:    Problem List Items Addressed This Visit     Class 3 obesity (HCC)    Weight loss of 15+ pounds on phentermine.  Doing well with diet and activity and weight loss efforts.  Will plan to continue on current regimen and monitor for any SE or stagnation of  weight loss.        Tobacco abuse counseling    Stop chantix- not effective for patient.  Will plan to begin bupropion 150mg  once daily for 3 days then increase to twice daily dosing.  Plan to stop smoking in 1 week.  Provided with nicotine patches to use to help with cravings and improve chance of success  She will let me know if this is not effective for her.  Handouts provided with tips on successful cessation.  F/U in 3 months or sooner if needed      Tobacco user - Primary   Relevant Medications   buPROPion (WELLBUTRIN SR) 150 MG 12 hr tablet   nicotine (NICODERM CQ - DOSED IN MG/24 HOURS) 14 mg/24hr patch   nicotine (NICODERM CQ - DOSED IN MG/24 HR) 7 mg/24hr patch   Other Visit Diagnoses     Screening for colon cancer       Relevant Orders   Cologuard   Screening mammogram for breast cancer       Relevant Orders   MM Digital Screening       orders and follow up as documented in EMR I discussed the assessment and treatment plan with the patient. The patient was provided an opportunity to ask questions and all were answered. The patient agreed with the plan and demonstrated an understanding of the instructions.   The patient was advised to call back or seek an in-person evaluation if the symptoms worsen or if the condition fails to improve as anticipated.  Follow-Up: in 3 months  I provided 20 minutes of non-face-to-face interaction with this non face-to-face encounter including intake, same-day documentation, and chart review.   Tollie Eth, NP , DNP, AGNP-c [facility] at [facility] [phone] (fax)

## 2021-03-29 NOTE — Assessment & Plan Note (Signed)
Weight loss of 15+ pounds on phentermine.  Doing well with diet and activity and weight loss efforts.  Will plan to continue on current regimen and monitor for any SE or stagnation of weight loss.

## 2021-03-29 NOTE — Assessment & Plan Note (Signed)
Stop chantix- not effective for patient.  Will plan to begin bupropion 150mg  once daily for 3 days then increase to twice daily dosing.  Plan to stop smoking in 1 week.  Provided with nicotine patches to use to help with cravings and improve chance of success  She will let me know if this is not effective for her.  Handouts provided with tips on successful cessation.  F/U in 3 months or sooner if needed

## 2021-04-16 ENCOUNTER — Other Ambulatory Visit: Payer: Self-pay | Admitting: Nurse Practitioner

## 2021-04-16 DIAGNOSIS — Z09 Encounter for follow-up examination after completed treatment for conditions other than malignant neoplasm: Secondary | ICD-10-CM

## 2021-05-03 ENCOUNTER — Other Ambulatory Visit: Payer: 59

## 2021-05-06 ENCOUNTER — Other Ambulatory Visit (HOSPITAL_BASED_OUTPATIENT_CLINIC_OR_DEPARTMENT_OTHER): Payer: Self-pay | Admitting: Nurse Practitioner

## 2021-05-06 DIAGNOSIS — R635 Abnormal weight gain: Secondary | ICD-10-CM

## 2021-06-27 ENCOUNTER — Encounter (HOSPITAL_BASED_OUTPATIENT_CLINIC_OR_DEPARTMENT_OTHER): Payer: Self-pay | Admitting: Nurse Practitioner

## 2021-06-27 ENCOUNTER — Ambulatory Visit (INDEPENDENT_AMBULATORY_CARE_PROVIDER_SITE_OTHER): Payer: 59 | Admitting: Nurse Practitioner

## 2021-06-27 VITALS — BP 117/72 | HR 88 | Temp 98.7°F | Ht 68.0 in | Wt 249.2 lb

## 2021-06-27 DIAGNOSIS — Z1231 Encounter for screening mammogram for malignant neoplasm of breast: Secondary | ICD-10-CM | POA: Diagnosis not present

## 2021-06-27 DIAGNOSIS — Z720 Tobacco use: Secondary | ICD-10-CM | POA: Diagnosis not present

## 2021-06-27 DIAGNOSIS — Z1211 Encounter for screening for malignant neoplasm of colon: Secondary | ICD-10-CM

## 2021-06-27 DIAGNOSIS — Z716 Tobacco abuse counseling: Secondary | ICD-10-CM

## 2021-06-27 DIAGNOSIS — R635 Abnormal weight gain: Secondary | ICD-10-CM | POA: Diagnosis not present

## 2021-06-27 MED ORDER — BUPROPION HCL ER (SR) 150 MG PO TB12: ORAL_TABLET | ORAL | 11 refills | Status: DC

## 2021-06-27 MED ORDER — PHENTERMINE HCL 37.5 MG PO CAPS: 37.5000 mg | ORAL_CAPSULE | Freq: Every morning | ORAL | 0 refills | Status: DC

## 2021-06-27 MED ORDER — PHENTERMINE HCL 37.5 MG PO CAPS: ORAL_CAPSULE | ORAL | 0 refills | Status: DC

## 2021-06-27 NOTE — Patient Instructions (Signed)
It was a pleasure seeing you today. I hope your time spent with [place] was pleasant and helpful. Please let us know if there is anything we can do to improve the service you receive.  ? ?Today we discussed concerns with: ? ?Weight gain ?Screening for colon cancer ? ?I have sent in more phentermine for you for the next 4 months- let's see if we can keep up the same momentum. Keep working on monitoring your diet and working out ? ?Congratulations on your smoking goals! You are doing great!! ? ?The following orders have been placed for you today: ? ?Orders Placed This Encounter  ?Procedures  ? Ambulatory referral to Gastroenterology  ?  Referral Priority:   Routine  ?  Referral Type:   Consultation  ?  Referral Reason:   Specialty Services Required  ?  Number of Visits Requested:   1  ? ? ? ?Important Office Information ?Lab Results ?If labs were ordered, please note that you will see results through MyChart as soon as they come available from [facility].  ?It takes up to 5 business days for the results to be routed to me and for me to review them once all of the lab results have come through from [facility]. I will make recommendations based on your results and send these through MyChart or someone from the office will call you to discuss. If your labs are abnormal, we may contact you to schedule a visit to discuss the results and make recommendations.  ?If you have not heard from [place] within 5 business days or you have waited longer than a week and your lab results have not come through on MyChart, please feel free to call the office or send a message through MyChart to follow-up on these labs.  ? ?Referrals ?If referrals were placed today, the office where the referral was sent will contact you either by phone or through MyChart to set up scheduling. Please note that it can take up to a week for the referral office to contact you. If you do not hear from them in a week, please contact the referral office directly to inquire  about scheduling.  ? ?Condition Treated ?If your condition worsens or you begin to have new symptoms, please schedule a follow-up appointment for further evaluation. If you are not sure if an appointment is needed, you may call the office to leave a message for the nurse and someone will contact you with recommendations.  ?If you have an urgent or life threatening emergency, please do not call the office, but seek emergency evaluation by calling 911 or going to the nearest emergency room for evaluation.  ? ?MyChart and Phone Calls ?Please do not use MyChart for urgent messages. It may take up to 3 business days for MyChart messages to be read by staff and if they are unable to handle the request, an additional 3 business days for them to be routed to me and for my response.  ?Messages sent to the provider through MyChart do not come directly to the provider, please allow time for these messages to be routed and for me to respond.  ?We get a large volume of MyChart messages daily and these are responded to in the order received.  ? ?For urgent messages, please call the office at [phone] and speak with the front office staff or leave a message on the line of my assistant for guidance.  ?We are seeing patients from the hours of 8:00 am through  5:00 pm and calls directly to the nurse may not be answered immediately due to seeing patients, but your call will be returned as soon as possible.  ?Phone  messages received after 4:00 PM Monday through Thursday may not be returned until the following business day. Phone messages received after 11:00 AM on Friday may not be returned until Monday.  ? ?After Hours ?We share on call hours with providers from other offices. If you have an urgent need after hours that cannot wait until the next business day, please contact the on call provider by calling the office number. A nurse will speak with you and contact the provider if needed for recommendations.  ?If you have an urgent  or life threatening emergency after hours, please do not call the on call provider, but seek emergency evaluation by calling 911 or going to the nearest emergency room for evaluation.  ? ?Paperwork ?All paperwork requires a minimum of 5 days to complete and return to you or the designated personnel. Please keep this in mind when bringing in forms or sending requests for paperwork completion to the office.  ?  ?

## 2021-06-27 NOTE — Progress Notes (Signed)
? ?Established Patient Office Visit ? ?Subjective   ?Patient ID: Lorraine [name], female    DOB: 11-29-1970  Age: 51 y.o. MRN: [id number] ? ?Chief Complaint  ?Patient presents with  ? Obesity  ? Nicotine Dependence  ? ? ?HPI ?Weight Loss ?Doing very well with the phentermine ?Going to the gym daily and monitoring diet closely ?Did take a week off of the gym, but was motivated to go back and has been consistent.  ?Feels great ?Initial weight: 263 ?Weight today 249 ?Would like to continue on medication management to continue to reach goal ?No SE of medication, no CP, palpitations, HA, elevated BP ? ?Smoking Cessation ?Has been using wellbutrin ?Down to 1/2 ppd  ?Switched to gum from the patches, which is helping her cravings ?Feels very good ? ?ROS ?All review of systems negative except what is listed in the HPI ? ?  ?Objective:  ?  ? ?BP 117/72   Pulse 88   Temp 98.7 ?F (37.1 ?C)   Ht 5\' 8"  (1.727 m)   Wt 249 lb 3.2 oz (113 kg)   SpO2 97%   BMI 37.89 kg/m?  ? ?Physical Exam ?Vitals and nursing note reviewed.  ?Constitutional:   ?   General: She is not in acute distress. ?   Appearance: Normal appearance. She is obese.  ?HENT:  ?   Head: Normocephalic.  ?   Right Ear: Hearing normal.  ?   Left Ear: Hearing normal.  ?   Mouth/Throat:  ?   Lips: Pink.  ?Eyes:  ?   General: Lids are normal. Vision grossly intact.  ?   Extraocular Movements: Extraocular movements intact.  ?   Conjunctiva/sclera: Conjunctivae normal.  ?   Pupils: Pupils are equal, round, and reactive to light.  ?   Funduscopic exam: ?   Right eye: No hemorrhage. Red reflex present.     ?   Left eye: No hemorrhage. Red reflex present. ?   Visual Fields: Right eye visual fields normal and left eye visual fields normal.  ?Neck:  ?   Thyroid: No thyromegaly.  ?   Vascular: No carotid bruit or JVD.  ?Cardiovascular:  ?   Rate and Rhythm: Normal rate and regular rhythm.  ?   Chest Wall: PMI is not displaced.  ?   Pulses: Normal pulses.  ?   Heart  sounds: Normal heart sounds. No murmur heard. ?Pulmonary:  ?   Effort: Pulmonary effort is normal.  ?   Breath sounds: Normal breath sounds. No wheezing.  ?Abdominal:  ?   General: Abdomen is flat. Bowel sounds are normal.  ?   Palpations: Abdomen is soft.  ?Musculoskeletal:     ?   General: Normal range of motion.  ?   Cervical back: Normal range of motion. No tenderness.  ?   Right lower leg: No edema.  ?   Left lower leg: No edema.  ?Lymphadenopathy:  ?   Cervical: No cervical adenopathy.  ?Skin: ?   General: Skin is warm and dry.  ?   Capillary Refill: Capillary refill takes less than 2 seconds.  ?Neurological:  ?   General: No focal deficit present.  ?   Mental Status: She is alert and oriented to person, place, and time.  ?   Motor: No weakness.  ?   Gait: Gait normal.  ?Psychiatric:     ?   Mood and Affect: Mood normal.     ?   Behavior: Behavior normal.     ?  Thought Content: Thought content normal.     ?   Judgment: Judgment normal.  ? ? ? ?No results found for any visits on 06/27/21. ? ?The 10-year ASCVD risk score (Arnett DK, et al., 2019) is: 1.4% ? ?  ?Assessment & Plan:  ? ?Problem List Items Addressed This Visit   ? ? Tobacco user  ?  Doing well with smoking cessation efforts with the use of nicotine patch to help control cravings.  She is also using Wellbutrin 150 mg twice a day which has been significantly helpful for her.  She is not having any alarm symptoms present today.  Patient praised today for her continued efforts and success.  Patient encouraged to continue with cessation efforts and avoid triggers that may cause relapse.  Recommend follow-up if stagnation in progress occurs. ? ?  ?  ? Relevant Medications  ? buPROPion (WELLBUTRIN SR) 150 MG 12 hr tablet  ? Weight gain - Primary  ?  Successful weight loss efforts.  Today she is down a total of 14 pounds.  She did have a recent level in her activity levels which did result in gaining some of the weight back that she had lost however she  is back on track now and doing quite well.  Patient praised today for her success and encouraged to continue with current diet and exercise routine to continue to reach her goals.  We will continue to monitor closely.  Refill provided on phentermine today.  No alarm symptoms present.  We will plan to follow-up in 4 months or sooner if needed. ? ?  ?  ? Relevant Medications  ? phentermine 37.5 MG capsule (Start on 08/22/2021)  ? phentermine 37.5 MG capsule (Start on 07/25/2021)  ? phentermine 37.5 MG capsule  ? phentermine 37.5 MG capsule (Start on 09/19/2021)  ? Tobacco abuse counseling  ?  Patient doing with very well on bupropion.  Information provided on smoking cessation and encouragement provided to patient for her efforts and success.  We will continue to monitor. ? ?  ?  ? ?Other Visit Diagnoses   ? ? Screening for colon cancer      ? Relevant Orders  ? Ambulatory referral to Gastroenterology  ? Screening mammogram for breast cancer      ? Relevant Orders  ? MM 3D SCREEN BREAST BILATERAL  ? ?  ? ? ?Return in about 4 months (around 10/27/2021) for virtual visit- weight and smoking.  ? ? ?Orma Render, NP ? ?

## 2021-07-09 NOTE — Assessment & Plan Note (Signed)
Doing well with smoking cessation efforts with the use of nicotine patch to help control cravings.  She is also using Wellbutrin 150 mg twice a day which has been significantly helpful for her.  She is not having any alarm symptoms present today.  Patient praised today for her continued efforts and success.  Patient encouraged to continue with cessation efforts and avoid triggers that may cause relapse.  Recommend follow-up if stagnation in progress occurs. ?

## 2021-07-09 NOTE — Assessment & Plan Note (Signed)
Successful weight loss efforts.  Today she is down a total of 14 pounds.  She did have a recent level in her activity levels which did result in gaining some of the weight back that she had lost however she is back on track now and doing quite well.  Patient praised today for her success and encouraged to continue with current diet and exercise routine to continue to reach her goals.  We will continue to monitor closely.  Refill provided on phentermine today.  No alarm symptoms present.  We will plan to follow-up in 4 months or sooner if needed. ?

## 2021-07-09 NOTE — Assessment & Plan Note (Signed)
Patient doing with very well on bupropion.  Information provided on smoking cessation and encouragement provided to patient for her efforts and success.  We will continue to monitor. ?

## 2021-10-28 ENCOUNTER — Ambulatory Visit (INDEPENDENT_AMBULATORY_CARE_PROVIDER_SITE_OTHER): Payer: 59 | Admitting: Nurse Practitioner

## 2021-10-28 ENCOUNTER — Encounter (HOSPITAL_BASED_OUTPATIENT_CLINIC_OR_DEPARTMENT_OTHER): Payer: Self-pay | Admitting: Nurse Practitioner

## 2021-10-28 DIAGNOSIS — L301 Dyshidrosis [pompholyx]: Secondary | ICD-10-CM | POA: Diagnosis not present

## 2021-10-28 DIAGNOSIS — Z716 Tobacco abuse counseling: Secondary | ICD-10-CM

## 2021-10-28 DIAGNOSIS — R635 Abnormal weight gain: Secondary | ICD-10-CM | POA: Diagnosis not present

## 2021-10-28 DIAGNOSIS — R052 Subacute cough: Secondary | ICD-10-CM | POA: Diagnosis not present

## 2021-10-28 MED ORDER — MOMETASONE FUROATE 0.1 % EX CREA: TOPICAL_CREAM | CUTANEOUS | 1 refills | Status: DC

## 2021-10-28 MED ORDER — NICOTINE 7 MG/24HR TD PT24: 7.0000 mg | MEDICATED_PATCH | Freq: Every day | TRANSDERMAL | 1 refills | Status: DC

## 2021-10-28 MED ORDER — PHENTERMINE HCL 37.5 MG PO CAPS: ORAL_CAPSULE | ORAL | 0 refills | Status: DC

## 2021-10-28 MED ORDER — GUAIFENESIN ER 600 MG PO TB12: 1200.0000 mg | ORAL_TABLET | Freq: Two times a day (BID) | ORAL | 1 refills | Status: DC

## 2021-10-28 MED ORDER — PHENTERMINE HCL 37.5 MG PO CAPS: 37.5000 mg | ORAL_CAPSULE | Freq: Every morning | ORAL | 0 refills | Status: DC

## 2021-10-28 NOTE — Assessment & Plan Note (Signed)
Patient is working very hard at increasing her physical activity during the day and she has started working out with her virtual reality headset on a daily basis.  She has had success with phentermine in the past, therefore we will go ahead and plan to restart this to help with her weight management efforts.  60-month supply has been sent to the pharmacy.  We will monitor closely for effectiveness of medication.  Plan follow-up in 3 months or sooner if needed.

## 2021-10-28 NOTE — Progress Notes (Signed)
Virtual Visit Encounter telephone visit.   I connected with  Lorraine [name] on 10/28/21 at 11:10 AM EDT by secure audio telemedicine application. I verified that I am speaking with the correct person using two identifiers.   I introduced myself as a Publishing rights manager with the practice. The limitations of evaluation and management by telemedicine discussed with the patient and the availability of in person appointments. The patient expressed verbal understanding and consent to proceed.  Participating parties in this visit include: Myself and patient  The patient is: Patient Location: Home I am: Provider Location: Office/Clinic Subjective:    CC and HPI: Lorraine [name] is a 51 y.o. year old female presenting for follow up of smoking cessation and weight management. Patient reports the following: Smoking: she tells me that the wellbutrin did not curb her desire for smoking or cravings. She has stopped this medication. She has tried the gum, but this made her feel nauseated and full. She has not tried the patches in the past. She is interested in trying this.   Weight: She has been out of the phentermine for a while, but she has started working out with a VR headset with a dancing and boxing. She is active every day. She would like to retry the phentermine.   Rash: She tells me that she has a rash on her thumb and palm of the hand with clear fluid filled small blisters. The rash is itching.   Cough: she endorses a cough recently after covid exposure. Her at home test was negative, but she has increased productive cough that is lingering. She denies fevers, chills, ShOB, wheezing, or difficulty sleeping due to the cough.   Past medical history, Surgical history, Family history not pertinant except as noted below, Social history, Allergies, and medications have been entered into the medical record, reviewed, and corrections made.   Review of Systems:  All review of systems negative  except what is listed in the HPI  Objective:    Alert and oriented x 4 Speaking in clear sentences with no shortness of breath. No distress.  Impression and Recommendations:    Problem List Items Addressed This Visit     Weight gain - Primary    Patient is working very hard at increasing her physical activity during the day and she has started working out with her virtual reality headset on a daily basis.  She has had success with phentermine in the past, therefore we will go ahead and plan to restart this to help with her weight management efforts.  42-month supply has been sent to the pharmacy.  We will monitor closely for effectiveness of medication.  Plan follow-up in 3 months or sooner if needed.      Relevant Medications   phentermine 37.5 MG capsule (Start on 12/23/2021)   phentermine 37.5 MG capsule (Start on 11/23/2021)   phentermine 37.5 MG capsule   Tobacco abuse counseling    Unfortunately the Wellbutrin was not helpful in curbing her appetite for cigarettes.  She still does wish to try to quit smoking.  She did try nicotine gum however this did cause some nausea therefore she has stopped that.  She has not tried the nicotine patches in the past.  We discussed the option of trying a low-dose nicotine patch to see if this helps to curb her cravings.  If this is not effective she will follow-up.      Relevant Medications   nicotine (NICODERM CQ - DOSED IN MG/24  HR) 7 mg/24hr patch   Dyshidrotic eczema    Symptoms and presentation consistent with dyshidrotic eczema.  Discussed findings with patient and recommendations for topical steroid cream twice a day until symptoms resolve.  If symptoms do not improve with treatment patient will follow-up.      Relevant Medications   mometasone (ELOCON) 0.1 % cream   Subacute cough    Cough following recent upper respiratory infection with increased mucus production.  At this time it is unclear if her upper respiratory infection was  related to COVID or other infectious origins.  She is not having any alarm symptoms at this time and her most concerning symptom is the increased mucus.  At this time recommend Mucinex twice a day to help relieve cough and facilitate improved removal of mucus from the lungs.  If her symptoms worsen or new symptoms develop she will contact the office for follow-up.      Relevant Medications   guaiFENesin (MUCINEX) 600 MG 12 hr tablet    orders and follow up as documented in EMR I discussed the assessment and treatment plan with the patient. The patient was provided an opportunity to ask questions and all were answered. The patient agreed with the plan and demonstrated an understanding of the instructions.   The patient was advised to call back or seek an in-person evaluation if the symptoms worsen or if the condition fails to improve as anticipated.  Follow-Up: in 3 months  I provided 22 minutes of non-face-to-face interaction with this non face-to-face encounter including intake, same-day documentation, and chart review.   Tollie Eth, NP , DNP, AGNP-c [facility] at [facility] [phone] (fax)

## 2021-10-28 NOTE — Assessment & Plan Note (Signed)
Unfortunately the Wellbutrin was not helpful in curbing her appetite for cigarettes.  She still does wish to try to quit smoking.  She did try nicotine gum however this did cause some nausea therefore she has stopped that.  She has not tried the nicotine patches in the past.  We discussed the option of trying a low-dose nicotine patch to see if this helps to curb her cravings.  If this is not effective she will follow-up.

## 2021-10-28 NOTE — Assessment & Plan Note (Signed)
Cough following recent upper respiratory infection with increased mucus production.  At this time it is unclear if her upper respiratory infection was related to COVID or other infectious origins.  She is not having any alarm symptoms at this time and her most concerning symptom is the increased mucus.  At this time recommend Mucinex twice a day to help relieve cough and facilitate improved removal of mucus from the lungs.  If her symptoms worsen or new symptoms develop she will contact the office for follow-up.

## 2021-10-28 NOTE — Assessment & Plan Note (Signed)
Symptoms and presentation consistent with dyshidrotic eczema.  Discussed findings with patient and recommendations for topical steroid cream twice a day until symptoms resolve.  If symptoms do not improve with treatment patient will follow-up.

## 2021-10-28 NOTE — Patient Instructions (Addendum)
Dyshidrotic Eczema- this is an allergic rash that we commonly see on the hands. It can be very itchy and form small blisters with clear fluid. I sent in mometasone cream for you to use on the rash twice a day in a very thin layer to see if we can get this cleared up.   I have sent in refills of your phentermine for three months.   I sent in the low dose nicotine patch- work to reduce the number of cigarettes a day while using this.   I have sent in mucinex for you to use for the cough. This will help bring up the mucous and get it out of your lungs. It will also help you not cough as much. If this does not get better, let me know.

## 2021-11-26 ENCOUNTER — Ambulatory Visit (HOSPITAL_BASED_OUTPATIENT_CLINIC_OR_DEPARTMENT_OTHER): Payer: 59 | Admitting: Nurse Practitioner

## 2021-11-26 ENCOUNTER — Encounter (HOSPITAL_BASED_OUTPATIENT_CLINIC_OR_DEPARTMENT_OTHER): Payer: Self-pay | Admitting: Nurse Practitioner

## 2021-11-26 VITALS — BP 118/75 | HR 86 | Ht 68.0 in | Wt 240.6 lb

## 2021-11-26 DIAGNOSIS — N926 Irregular menstruation, unspecified: Secondary | ICD-10-CM

## 2021-11-26 NOTE — Patient Instructions (Signed)
It sounds like you are starting through peri-menopause. We will get some labs to make sure that this is what is going on. We may be able to start a low dose birth control- I will do some research and see if you would be a candidate.

## 2021-11-26 NOTE — Progress Notes (Signed)
  Orma Render, DNP, AGNP-c Primary Care & Sports Medicine [address]  [place], [place] [address] [phone]  Subjective:   Lorraine [name] is a 51 y.o. female presents to day for evaluation of: Amenorrhea (Pt here for late period, she stated she has not had a period for a whole month now, she has taken 4 pregnant test all came back negative )  Missed menses Lorraine [name] endorses missed period for a month. Pregnancy tests have all come back negative. She has not had this occur in the past. She reports her periods are typically fairly regular. She has noted that in recent months there has been longer periods between menses on occasion. She denies any abdominal pain, nausea, vomiting, dizziness.   PMH, Medications, and Allergies reviewed and updated in chart as appropriate.   ROS negative except for what is listed in HPI. Objective:  BP 118/75   Pulse 86   Ht 5\' 8"  (1.727 m)   Wt 240 lb 9.6 oz (109.1 kg)   SpO2 100%   BMI 36.58 kg/m  Physical Exam Vitals and nursing note reviewed.  Constitutional:      Appearance: Normal appearance.  HENT:     Head: Normocephalic.  Cardiovascular:     Rate and Rhythm: Normal rate and regular rhythm.     Pulses: Normal pulses.     Heart sounds: Normal heart sounds.  Pulmonary:     Effort: Pulmonary effort is normal.     Breath sounds: Normal breath sounds.  Abdominal:     General: Abdomen is flat. Bowel sounds are normal.     Palpations: Abdomen is soft.  Skin:    General: Skin is warm and dry.     Capillary Refill: Capillary refill takes less than 2 seconds.  Neurological:     General: No focal deficit present.     Mental Status: She is alert and oriented to person, place, and time.           Assessment & Plan:   Problem List Items Addressed This Visit     Missed menses - Primary    51 year old female with missed menses for 1 month. Likely peri-menopausal menstrual irregularities given the  patients age and negative pregnancy tests at home. We will evaluate with labs today to ensure pregnancy is negative. Discussed with patient that hormone testing can help establish where she is in her cycle and will not tell us specifically if she will not have any more periods. Consider possible low dose OCP use to help with regulation if desired. Given that patient is a tobacco user, we are limited to progestin only options. Will follow      Relevant Orders   TSH (Completed)   FSH/LH (Completed)   B-HCG Quant      Orma Render, DNP, AGNP-c 12/08/2021  1:05 AM    History, Medications, Surgery, SDOH, and Family History reviewed and updated as appropriate.

## 2021-11-28 LAB — FSH/LH
FSH: 3.3 m[IU]/mL
LH: 4 m[IU]/mL

## 2021-11-28 LAB — TSH: TSH: 1.08 u[IU]/mL (ref 0.450–4.500)

## 2021-11-28 LAB — HCG, SERUM, QUALITATIVE: hCG,Beta Subunit,Qual,Serum: NEGATIVE m[IU]/mL (ref <6)

## 2021-12-08 DIAGNOSIS — N926 Irregular menstruation, unspecified: Secondary | ICD-10-CM | POA: Insufficient documentation

## 2021-12-08 NOTE — Assessment & Plan Note (Signed)
51 year old female with missed menses for 1 month. Likely peri-menopausal menstrual irregularities given the patients age and negative pregnancy tests at home. We will evaluate with labs today to ensure pregnancy is negative. Discussed with patient that hormone testing can help establish where she is in her cycle and will not tell us specifically if she will not have any more periods. Consider possible low dose OCP use to help with regulation if desired. Given that patient is a tobacco user, we are limited to progestin only options. Will follow

## 2022-02-22 ENCOUNTER — Other Ambulatory Visit (HOSPITAL_BASED_OUTPATIENT_CLINIC_OR_DEPARTMENT_OTHER): Payer: Self-pay | Admitting: Nurse Practitioner

## 2022-02-22 DIAGNOSIS — R635 Abnormal weight gain: Secondary | ICD-10-CM

## 2022-02-24 MED ORDER — PHENTERMINE HCL 37.5 MG PO CAPS: ORAL_CAPSULE | ORAL | 0 refills | Status: DC

## 2022-03-25 ENCOUNTER — Telehealth: Payer: 59 | Admitting: Emergency Medicine

## 2022-03-25 ENCOUNTER — Other Ambulatory Visit (HOSPITAL_BASED_OUTPATIENT_CLINIC_OR_DEPARTMENT_OTHER): Payer: Self-pay | Admitting: Nurse Practitioner

## 2022-03-25 ENCOUNTER — Telehealth: Payer: 59 | Admitting: Family Medicine

## 2022-03-25 DIAGNOSIS — B9689 Other specified bacterial agents as the cause of diseases classified elsewhere: Secondary | ICD-10-CM | POA: Diagnosis not present

## 2022-03-25 DIAGNOSIS — M549 Dorsalgia, unspecified: Secondary | ICD-10-CM

## 2022-03-25 DIAGNOSIS — N760 Acute vaginitis: Secondary | ICD-10-CM | POA: Diagnosis not present

## 2022-03-25 DIAGNOSIS — R635 Abnormal weight gain: Secondary | ICD-10-CM

## 2022-03-25 MED ORDER — CYCLOBENZAPRINE HCL 10 MG PO TABS: 10.0000 mg | ORAL_TABLET | Freq: Three times a day (TID) | ORAL | 0 refills | Status: DC | PRN

## 2022-03-25 MED ORDER — METRONIDAZOLE 500 MG PO TABS: 500.0000 mg | ORAL_TABLET | Freq: Two times a day (BID) | ORAL | 0 refills | Status: AC

## 2022-03-25 NOTE — Progress Notes (Signed)
We are sorry that you are not feeling well.  Here is how we plan to help!  Based on what you have shared with me it looks like you mostly have acute back pain.  Acute back pain is defined as musculoskeletal pain that can resolve in 1-3 [place] with conservative treatment.  I recommend taking Ibuprofen if able 3x per day.  In addition, I have prescribed Flexeril 10 mg every eight hours as needed which is a muscle relaxer  Some patients experience stomach irritation or in increased heartburn with anti-inflammatory drugs.  Please keep in mind that muscle relaxer's can cause fatigue and should not be taken while at work or driving.  Back pain is very common.  The pain often gets better over time.  The cause of back pain is usually not dangerous.  Most people can learn to manage their back pain on their own.  Home Care Stay active.  Start with short walks on flat ground if you can.  Try to walk farther each day. Do not sit, drive or stand in one place for more than 30 minutes.  Do not stay in bed. Do not avoid exercise or work.  Activity can help your back heal faster. Be careful when you bend or lift an object.  Bend at your knees, keep the object close to you, and do not twist. Sleep on a firm mattress.  Lie on your side, and bend your knees.  If you lie on your back, put a pillow under your knees. Only take medicines as told by your doctor. Put ice on the injured area. Put ice in a plastic bag Place a towel between your skin and the bag Leave the ice on for 15-20 minutes, 3-4 times a day for the first 2-3 days. 210 After that, you can switch between ice and heat packs. Ask your doctor about back exercises or massage. Avoid feeling anxious or stressed.  Find good ways to deal with stress, such as exercise.  Get Help Right Way If: Your pain does not go away with rest or medicine. Your pain does not go away in 1 week. You have new problems. You do not feel well. The pain spreads into your  legs. You cannot control when you poop (bowel movement) or pee (urinate) You feel sick to your stomach (nauseous) or throw up (vomit) You have belly (abdominal) pain. You feel like you may pass out (faint). If you develop a fever.  Make Sure you: Understand these instructions. Will watch your condition Will get help right away if you are not doing well or get worse.  Your e-visit answers were reviewed by a board certified advanced clinical practitioner to complete your personal care plan.  Depending on the condition, your plan could have included both over the counter or prescription medications.  If there is a problem please reply  once you have received a response from your provider.  Your safety is important to [place].  If you have drug allergies check your prescription carefully.    You can use MyChart to ask questions about today's visit, request a non-urgent call back, or ask for a work or school excuse for 24 hours related to this e-Visit. If it has been greater than 24 hours you will need to follow up with your provider, or enter a new e-Visit to address those concerns.  You will get an e-mail in the next two days asking about your experience.  I hope that your e-visit has been  valuable and will speed your recovery. Thank you for using e-visits.  Approximately 5 minutes was used in reviewing the patient's chart, questionnaire, prescribing medications, and documentation.

## 2022-03-25 NOTE — Progress Notes (Signed)

## 2022-12-01 ENCOUNTER — Telehealth: Payer: 59 | Admitting: Family Medicine

## 2022-12-01 DIAGNOSIS — J3089 Other allergic rhinitis: Secondary | ICD-10-CM | POA: Diagnosis not present

## 2022-12-01 DIAGNOSIS — R51 Headache with orthostatic component, not elsewhere classified: Secondary | ICD-10-CM

## 2022-12-01 MED ORDER — LEVOCETIRIZINE DIHYDROCHLORIDE 5 MG PO TABS: 5.0000 mg | ORAL_TABLET | Freq: Every evening | ORAL | Status: AC

## 2022-12-01 MED ORDER — RIZATRIPTAN BENZOATE 10 MG PO TABS: 10.0000 mg | ORAL_TABLET | ORAL | 0 refills | Status: DC | PRN

## 2022-12-01 MED ORDER — FLUTICASONE PROPIONATE 50 MCG/ACT NA SUSP: 2.0000 | Freq: Every day | NASAL | 0 refills | Status: DC

## 2022-12-01 NOTE — Patient Instructions (Addendum)
Lorraine [name], thank you for joining Freddy Finner, NP for today's virtual visit.  While this provider is not your primary care provider (PCP), if your PCP is located in our provider database this encounter information will be shared with them immediately following your visit.   A [facility] MyChart account gives you access to today's visit and all your visits, tests, and labs performed at [facility] " click here if you don't have a [facility] MyChart account or go to [email or web address]  Consent: (Patient) Lorraine [name] provided verbal consent for this virtual visit at the beginning of the encounter.  Current Medications:  Current Outpatient Medications:    fluticasone (FLONASE) 50 MCG/ACT nasal spray, Place 2 sprays into both nostrils daily., Disp: 16 g, Rfl: 0   levocetirizine (XYZAL) 5 MG tablet, Take 1 tablet (5 mg total) by mouth every evening., Disp: 30 tablet, Rfl: 01   rizatriptan (MAXALT) 10 MG tablet, Take 1 tablet (10 mg total) by mouth as needed for migraine. May repeat in 2 hours if needed, not to take more than two times in a day., Disp: 10 tablet, Rfl: 0   cyclobenzaprine (FLEXERIL) 10 MG tablet, Take 1 tablet (10 mg total) by mouth 3 (three) times daily as needed for muscle spasms., Disp: 10 tablet, Rfl: 0   phentermine 37.5 MG capsule, One capsule by mouth qAM, Disp: 30 capsule, Rfl: 0   Medications ordered in this encounter:  Meds ordered this encounter  Medications   rizatriptan (MAXALT) 10 MG tablet    Sig: Take 1 tablet (10 mg total) by mouth as needed for migraine. May repeat in 2 hours if needed, not to take more than two times in a day.    Dispense:  10 tablet    Refill:  0    Order Specific Question:   Supervising Provider    Answer:   Merrilee Jansky [[id number]]   fluticasone (FLONASE) 50 MCG/ACT nasal spray    Sig: Place 2 sprays into both nostrils daily.    Dispense:  16 g    Refill:  0    Order Specific Question:    Supervising Provider    Answer:   Merrilee Jansky [[id number]]   levocetirizine (XYZAL) 5 MG tablet    Sig: Take 1 tablet (5 mg total) by mouth every evening.    Dispense:  30 tablet    Refill:  01    Order Specific Question:   Supervising Provider    Answer:   Merrilee Jansky [id number]     *If you need refills on other medications prior to your next appointment, please contact your pharmacy*  Follow-Up: Call back or seek an in-person evaluation if the symptoms worsen or if the condition fails to improve as anticipated.  [facility] [phone]  Other Instructions  Low threshold for being seen in person if not better in a day or two.    Questionable positional headache  Allergies/Sinus pressure  Continue to reduce smoking Monitor BP  Keep new pt appt and discuss if needed  General Headache Without Cause A headache is pain or discomfort you feel around the head or neck area. There are many causes and types of headaches. In some cases, the cause may not be found. Follow these instructions at home: Watch your condition for any changes. Let your doctor know about them. Take these steps to help with your condition: Managing pain     Take over-the-counter and prescription  medicines only as told by your doctor. This includes medicines for pain that are taken by mouth or put on the skin. Lie down in a dark, quiet room when you have a headache. If told, put ice on your head and neck area: Put ice in a plastic bag. Place a towel between your skin and the bag. Leave the ice on for 20 minutes, 2-3 times per day. Take off the ice if your skin turns bright red. This is very important. If you cannot feel pain, heat, or cold, you have a greater risk of damage to the area. If told, put heat on the affected area. Use the heat source that your doctor recommends, such as a moist heat pack or a heating pad. Place a towel between your skin and the heat source. Leave the heat  on for 20-30 minutes. Take off the heat if your skin turns bright red. This is very important. If you cannot feel pain, heat, or cold, you have a greater risk of getting burned. Keep lights dim if bright lights bother you or make your headaches worse. Eating and drinking Eat meals on a regular schedule. If you drink alcohol: Limit how much you have to: 0-1 drink a day for women who are not pregnant. 0-2 drinks a day for men. Know how much alcohol is in a drink. In the [place] one drink equals one 12 oz bottle of beer (355 mL), one 5 oz glass of wine (148 mL), or one 1 oz glass of hard liquor (44 mL). Stop drinking caffeine, or drink less caffeine. General instructions  Keep a journal to find out if certain things bring on headaches. For example, write down: What you eat and drink. How much sleep you get. Any change to your diet or medicines. Get a massage or try other ways to relax. Limit stress. Sit up straight. Do not tighten (tense) your muscles. Do not smoke or use any products that contain nicotine or tobacco. If you need help quitting, ask your doctor. Exercise regularly as told by your doctor. Get enough sleep. This often means 7-9 hours of sleep each night. Keep all follow-up visits. This is important. Contact a doctor if: Medicine does not help your symptoms. You have a headache that feels different than the other headaches. You feel like you may vomit (nauseous) or you vomit. You have a fever. Get help right away if: Your headache: Gets very bad quickly. Gets worse after a lot of physical activity. You have any of these symptoms: You continue to vomit. A stiff neck. Trouble seeing. Your eye or ear hurts. Trouble speaking. Weak muscles or you lose muscle control. You lose your balance or have trouble walking. You feel like you will pass out (faint) or you pass out. You are mixed up (confused). You have a seizure. These symptoms may be an emergency. Get help right  away. Call your local emergency services (911 in the [place] Do not wait to see if the symptoms will go away. Do not drive yourself to the hospital. Summary A headache is pain or discomfort that is felt around the head or neck area. There are many causes and types of headaches. In some cases, the cause may not be found. Keep a journal to help find out what causes your headaches. Watch your condition for any changes. Let your doctor know about them. Contact a doctor if you have a headache that is different from usual, or if medicine does not help your headache.  Get help right away if your headache gets very bad, you throw up, you have trouble seeing, you lose your balance, or you have a seizure. This information is not intended to replace advice given to you by your health care provider. Make sure you discuss any questions you have with your health care provider. Document Revised: 07/25/2020 Document Reviewed: 07/25/2020 Elsevier Patient Education  2024 Elsevier Inc.    If you have been instructed to have an in-person evaluation today at a local Urgent Care facility, please use the link below. It will take you to a list of all of our available [facility], including address, phone number and hours of operation. Please do not delay care.  [facility]  If you or a family member do not have a primary care provider, use the link below to schedule a visit and establish care. When you choose a [facility] primary care physician or advanced practice provider, you gain a long-term partner in health. Find a Primary Care Provider  Learn more about [facility]'s in-office and virtual care options: [facility] - Get Care Now

## 2022-12-01 NOTE — Progress Notes (Signed)
Virtual Visit Consent   Lorraine [name], you are scheduled for a virtual visit with a [facility] provider today. Just as with appointments in the office, your consent must be obtained to participate. Your consent will be active for this visit and any virtual visit you may have with one of our providers in the next 365 days. If you have a MyChart account, a copy of this consent can be sent to you electronically.  As this is a virtual visit, video technology does not allow for your provider to perform a traditional examination. This may limit your provider's ability to fully assess your condition. If your provider identifies any concerns that need to be evaluated in person or the need to arrange testing (such as labs, EKG, etc.), we will make arrangements to do so. Although advances in technology are sophisticated, we cannot ensure that it will always work on either your end or our end. If the connection with a video visit is poor, the visit may have to be switched to a telephone visit. With either a video or telephone visit, we are not always able to ensure that we have a secure connection.  By engaging in this virtual visit, you consent to the provision of healthcare and authorize for your insurance to be billed (if applicable) for the services provided during this visit. Depending on your insurance coverage, you may receive a charge related to this service.  I need to obtain your verbal consent now. Are you willing to proceed with your visit today? Lorraine [name] has provided verbal consent on 12/01/2022 for a virtual visit (video or telephone). Freddy Finner, NP  Date: 12/01/2022 2:32 PM  Virtual Visit via Video Note   I, Freddy Finner, connected with  Lorraine [name]  ([id number], [birth year]) on 12/01/22 at  2:30 PM EDT by a video-enabled telemedicine application and verified that I am speaking with the correct person using two identifiers.  Location: Patient: Virtual Visit  Location Patient: Home Provider: Virtual Visit Location Provider: Home Office   I discussed the limitations of evaluation and management by telemedicine and the availability of in person appointments. The patient expressed understanding and agreed to proceed.    History of Present Illness: Lorraine [name] is a 52 y.o. who identifies as a female who was assigned female at birth, and is being seen today for headache  Onset was right side headache- that is hurting only when leaning forward or standing up. No stroke signs NO URI, Reports some allergies. Associated symptoms are runny nose not currently on anything,  Noted to have vomiting after a meal on Friday night prior to headache onset. Eased with some ibuprofen use twice daily   BP 122/89 last night - Sunday night    Modifying factors are as above Denies chest pain, shortness of breath, fevers, chills Still smoking daily, but not as much  Reports she might be alittle dehydrated as well  Problems:  Patient Active Problem List   Diagnosis Date Noted   Missed menses 12/08/2021   Dyshidrotic eczema 10/28/2021   Subacute cough 10/28/2021   Tobacco abuse counseling 03/29/2021   Encounter for medical examination to establish care 12/28/2020   Weight gain 12/28/2020   Vaginal odor 12/28/2020   Nasal turbinate hypertrophy 11/10/2018   Contraceptive management 04/07/2012   Hidradenitis suppurativa 04/02/2011   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 Unknown Did it involve sudden or severe rash/hives, skin peeling, or any reaction on the inside of your mouth or nose? Unknown Did you need to seek medical attention at a hospital or doctor's office? Unknown When did it last happen?childhood reaction If all above answers are  "NO", may proceed with cephalosporin use.    Medications:  Current Outpatient Medications:    cyclobenzaprine (FLEXERIL) 10 MG tablet, Take 1 tablet (10 mg total) by mouth 3 (three) times daily as needed for muscle spasms., Disp: 10 tablet, Rfl: 0   phentermine 37.5 MG capsule, One capsule by mouth qAM, Disp: 30 capsule, Rfl: 0  Observations/Objective: Patient is well-developed, well-nourished in no acute distress.  Resting comfortably  at home.  Head is normocephalic, atraumatic.  No labored breathing.  Speech is clear and coherent with logical content.  Patient is alert and oriented at baseline.    Assessment and Plan:  1. Positional headache  - rizatriptan (MAXALT) 10 MG tablet; Take 1 tablet (10 mg total) by mouth as needed for migraine. May repeat in 2 hours if needed, not to take more than two times in a day.  Dispense: 10 tablet; Refill: 0 - fluticasone (FLONASE) 50 MCG/ACT nasal spray; Place 2 sprays into both nostrils daily.  Dispense: 16 g; Refill: 0 - levocetirizine (XYZAL) 5 MG tablet; Take 1 tablet (5 mg total) by mouth every evening.  Dispense: 30 tablet; Refill: 01    2. Environmental and seasonal allergies  - fluticasone (FLONASE) 50 MCG/ACT nasal spray; Place 2 sprays into both nostrils daily.  Dispense: 16 g; Refill: 0 - levocetirizine (XYZAL) 5 MG tablet; Take 1 tablet (5 mg total) by mouth every evening.  Dispense: 30 tablet; Refill: 01   -no red flags for ED needs  Low threshold for being seen in person if not better in a day or two.    Questionable positional headache  Allergies/Sinus pressure  Continue to reduce smoking Monitor BP  Keep new pt appt and discuss if needed   Reviewed side effects, risks and benefits of medication.    Patient acknowledged agreement and understanding of the plan.   Past Medical, Surgical, Social History, Allergies, and Medications have been Reviewed.    Follow Up Instructions: I discussed the assessment and  treatment plan with the patient. The patient was provided an opportunity to ask questions and all were answered. The patient agreed with the plan and demonstrated an understanding of the instructions.  A copy of instructions were sent to the patient via MyChart unless otherwise noted below.    The patient was advised to call back or seek an in-person evaluation if the symptoms worsen or if the condition fails to improve as anticipated.  Time:  I spent 15 minutes with the patient via telehealth technology discussing the above problems/concerns.    Freddy Finner, NP

## 2022-12-29 ENCOUNTER — Encounter: Payer: Self-pay | Admitting: Nurse Practitioner

## 2022-12-29 ENCOUNTER — Ambulatory Visit: Payer: 59 | Admitting: Nurse Practitioner

## 2022-12-29 VITALS — BP 123/61 | HR 75 | Temp 97.1°F | Ht 68.0 in | Wt 255.0 lb

## 2022-12-29 DIAGNOSIS — N6452 Nipple discharge: Secondary | ICD-10-CM | POA: Diagnosis not present

## 2022-12-29 DIAGNOSIS — Z1211 Encounter for screening for malignant neoplasm of colon: Secondary | ICD-10-CM

## 2022-12-29 DIAGNOSIS — Z23 Encounter for immunization: Secondary | ICD-10-CM | POA: Insufficient documentation

## 2022-12-29 DIAGNOSIS — F17200 Nicotine dependence, unspecified, uncomplicated: Secondary | ICD-10-CM

## 2022-12-29 DIAGNOSIS — L732 Hidradenitis suppurativa: Secondary | ICD-10-CM

## 2022-12-29 DIAGNOSIS — Z1231 Encounter for screening mammogram for malignant neoplasm of breast: Secondary | ICD-10-CM

## 2022-12-29 DIAGNOSIS — N926 Irregular menstruation, unspecified: Secondary | ICD-10-CM | POA: Insufficient documentation

## 2022-12-29 HISTORY — DX: Nicotine dependence, unspecified, uncomplicated: F17.200

## 2022-12-29 MED ORDER — DOXYCYCLINE HYCLATE 100 MG PO TABS
100.0000 mg | ORAL_TABLET | Freq: Two times a day (BID) | ORAL | 0 refills | Status: DC
Start: 2022-12-29 — End: 2023-01-26

## 2022-12-29 NOTE — Assessment & Plan Note (Signed)
Patient educated on CDC recommendation for the vaccine. Verbal consent was obtained from the patient, vaccine administered by nurse, no sign of adverse reactions noted at this time. Patient education on arm soreness and use of tylenol or  for this patient  was discussed. Patient educated on the signs and symptoms of adverse effect and advise to contact the office if they occur. Vaccine information sheet given to patient.

## 2022-12-29 NOTE — Progress Notes (Signed)
New Patient Office Visit  Subjective:  Patient ID: Ellory Rearden, female    DOB: Mar 16, 1970  Age: 52 y.o. MRN: [id number]  CC:  Chief Complaint  Patient presents with   Establish Care    HPI Kadijatou Neighbours is a 52 y.o. female  has a past medical history of Allergy, Hidradenitis suppurativa (04/02/2011), Seasonal allergies, SVD (spontaneous vaginal delivery), and Tobacco use disorder (12/29/2022).  Patient presents to establish care for her chronic medical conditions .previous PCP Early NP   In September 2024 she was evaluated for positional headache which has since resolved  Please see assessment and plan section for full HPI     Past Medical History:  Diagnosis Date   Allergy    Seasonal   Hidradenitis suppurativa 04/02/2011   Seasonal allergies    SVD (spontaneous vaginal delivery)    x 1   Tobacco use disorder 12/29/2022    Past Surgical History:  Procedure Laterality Date   BREAST DUCTAL SYSTEM EXCISION Bilateral 10/31/2019   Procedure: BILATERAL BREAST DUCTAL EXCISION;  Surgeon: Abigail Miyamoto, MD;  Location: [facility];  Service: General;  Laterality: Bilateral;  LMA   CHOLECYSTECTOMY     MASS EXCISION Right 08/19/2018   Procedure: EXCISION CHRONIC RIGHT BREAST ABSCESS;  Surgeon: Abigail Miyamoto, MD;  Location: [facility] OR;  Service: General;  Laterality: Right;   PILONIDAL CYST EXCISION     WISDOM TOOTH EXTRACTION      Family History  Problem Relation Age of Onset   Heart disease Father    Diabetes Father    Thyroid disease Mother        Pituitary tumor   Kidney disease Mother        renal tumor- benign   Thyroid disease Sister    Diabetes Paternal Aunt    Diabetes Paternal Uncle    Diabetes Maternal Grandmother     Social History   Socioeconomic History   Marital status: Significant Other    Spouse name: Not on file   Number of children: 1   Years of education: Not on file   Highest education level: Not on file   Occupational History   Not on file  Tobacco Use   Smoking status: Every Day    Current packs/day: 0.50    Average packs/day: 0.5 packs/day for 24.0 years (12.0 ttl pk-yrs)    Types: Cigarettes   Smokeless tobacco: Never  Vaping Use   Vaping status: Never Used  Substance and Sexual Activity   Alcohol use: Yes    Alcohol/week: 1.0 standard drink of alcohol    Types: 1 Glasses of wine per week    Comment: SOCIALLY (TWICE A MONTH)   Drug use: No   Sexual activity: Yes    Birth control/protection: Condom  Other Topics Concern   Not on file  Social History Narrative   Lives with her boyfriend and daughter    Social Determinants of Health   Financial Resource Strain: Not on file  Food Insecurity: No Food Insecurity (09/18/2020)   Received from [facility], [facility]   Hunger Vital Sign    Worried About Running Out of Food in the Last Year: Never true    Ran Out of Food in the Last Year: Never true  Transportation Needs: Not on file  Physical Activity: Insufficiently Active (10/01/2017)   Exercise Vital Sign    Days of Exercise per Week: 2 days    Minutes of Exercise per Session: 30 min  Stress:  Not on file  Social Connections: Unknown (07/23/2021)   Received from [facility], [facility]   Social Network    Social Network: Not on file  Intimate Partner Violence: Unknown (06/14/2021)   Received from [facility], [facility]   HITS    Physically Hurt: Not on file    Insult or Talk Down To: Not on file    Threaten Physical Harm: Not on file    Scream or Curse: Not on file    ROS Review of Systems  Constitutional:  Negative for appetite change, chills, fatigue and fever.  HENT:  Negative for congestion, postnasal drip, rhinorrhea and sneezing.   Respiratory:  Negative for cough, shortness of breath and wheezing.   Cardiovascular:  Negative for chest pain, palpitations and leg swelling.  Gastrointestinal:  Negative for abdominal pain, constipation, nausea and  vomiting.  Genitourinary:  Positive for menstrual problem. Negative for difficulty urinating, dysuria, flank pain and frequency.  Musculoskeletal:  Negative for arthralgias, back pain, joint swelling and myalgias.  Skin:  Negative for color change, pallor, rash and wound.  Neurological:  Negative for dizziness, facial asymmetry, weakness, numbness and headaches.  Psychiatric/Behavioral:  Negative for behavioral problems, confusion, self-injury and suicidal ideas.     Objective:   Today's Vitals: BP 123/61   Pulse 75   Temp (!) 97.1 F (36.2 C)   Wt 255 lb (115.7 kg)   SpO2 99%   BMI 38.77 kg/m   Physical Exam Vitals and nursing note reviewed. Exam conducted with a chaperone present.  Constitutional:      General: She is not in acute distress.    Appearance: Normal appearance. She is obese. She is not ill-appearing, toxic-appearing or diaphoretic.  HENT:     Mouth/Throat:     Mouth: Mucous membranes are moist.     Pharynx: Oropharynx is clear. No oropharyngeal exudate or posterior oropharyngeal erythema.  Eyes:     General: No scleral icterus.       Right eye: No discharge.        Left eye: No discharge.     Extraocular Movements: Extraocular movements intact.     Conjunctiva/sclera: Conjunctivae normal.  Cardiovascular:     Rate and Rhythm: Normal rate and regular rhythm.     Pulses: Normal pulses.     Heart sounds: Normal heart sounds. No murmur heard.    No friction rub. No gallop.  Pulmonary:     Effort: Pulmonary effort is normal. No respiratory distress.     Breath sounds: Normal breath sounds. No stridor. No wheezing, rhonchi or rales.  Chest:     Chest wall: No tenderness.  Breasts:    Tanner Score is 5.     Right: No swelling, bleeding, nipple discharge, skin change or tenderness.     Left: Nipple discharge present. No swelling, bleeding, skin change or tenderness.     Comments: Left nipple appears moist.  No redness, swelling purulent drainage  noted Abdominal:     General: There is no distension.     Palpations: Abdomen is soft.     Tenderness: There is no abdominal tenderness. There is no right CVA tenderness, left CVA tenderness or guarding.  Musculoskeletal:        General: No swelling, tenderness, deformity or signs of injury.     Right lower leg: No edema.     Left lower leg: No edema.  Skin:    General: Skin is warm and dry.     Capillary Refill:  Capillary refill takes less than 2 seconds.     Coloration: Skin is not jaundiced or pale.     Findings: Lesion present. No bruising or erythema.     Comments: Lesions consistent with hidradenitis noted under bilateral breast and left axillary area No redness or swelling noted  Neurological:     Mental Status: She is alert and oriented to person, place, and time.     Motor: No weakness.     Coordination: Coordination normal.     Gait: Gait normal.  Psychiatric:        Mood and Affect: Mood normal.        Behavior: Behavior normal.        Thought Content: Thought content normal.        Judgment: Judgment normal.     Assessment & Plan:   Problem List Items Addressed This Visit       Musculoskeletal and Integument   Hidradenitis suppurativa    Lesions consistent with hidradenitis noted under bilateral breast and left axillary area No redness or swelling noted Recommend monitoring for now for new occurrences Patient would like referral to dermatology referral placed Smoking cessation encouraged Patient encouraged to lose weight      Relevant Orders   Ambulatory referral to Dermatology     Other   Need for influenza vaccination    Patient educated on CDC recommendation for the vaccine. Verbal consent was obtained from the patient, vaccine administered by nurse, no sign of adverse reactions noted at this time. Patient education on arm soreness and use of tylenol or for this patient  was discussed. Patient educated on the signs and symptoms of adverse effect and  advise to contact the office if they occur. Vaccine information sheet given to patient.        Relevant Orders   Flu vaccine trivalent PF, 6mos and older(Flulaval,Afluria,Fluarix,Fluzone) (Completed)   Tobacco use disorder    Smokes about 0.5 pack/day.  Started smoking at age 46  Asked about quitting: confirms that he/she currently smokes cigarettes Advise to quit smoking: Educated about QUITTING to reduce the risk of cancer, cardio and cerebrovascular disease. Assess willingness: Unwilling to quit at this time, but is working on cutting back. Assist with counseling and pharmacotherapy: Counseled for 5 minutes and literature provided. Arrange for follow up: follow up in 1 months and continue to offer help.       Irregular menses    No menses since November 11, 2022 Stated that her menses has been regular prior to then Home pregnancy test was negative I discussed with the patient that she is probably in her perimenopausal stage        Screening mammogram for breast cancer   Relevant Orders   MM 3D DIAGNOSTIC MAMMOGRAM BILATERAL BREAST   Nipple discharge - Primary    The patient had  Bilateral BILATERAL BREAST DUCTAL EXCISION in 2021. has noticed nipple drainage since about 9 months ago, she denies fever, chills, malaise, tenderness  Left nipple appears moist, no bloody discharge noted, no tenderness on palpation Diagnostic mammogram ordered and [place] today. Doxycycline 100 mg twice daily for 10 days ordered   Results of last mammogram recommendation below  IMPRESSION: The fluid collection on the right may represent a seroma with overlying skin breakdown given the lack of other signs of infection. A low-grade infection is not completely excluded. No cause for left-sided discharge identified. The left-sided discharge is not typical for a papilloma or malignancy. The malodorous nature of  the discharge raises the possibility of low-grade infection although there is no erythema or  pain. The abnormal lymph node in the right axilla may be reactive given the findings on the right.   RECOMMENDATION: The patient was placed on a 10 day course of doxycycline. Discharge on the left is not typical for a papilloma or malignancy. If it does not resolve with antibiotics, an MRI could be performed if clinical concern persists. Recommend surgical consultation for the patient's symptoms on the right. There are no signs of cancer on the right today. Recommend 3 month follow-up ultrasound of the abnormal right axillary node. If the node does not return to normal, recommend biopsy.               Relevant Medications   doxycycline (VIBRA-TABS) 100 MG tablet   Other Relevant Orders   MM 3D DIAGNOSTIC MAMMOGRAM BILATERAL BREAST   US BREAST COMPLETE UNI LEFT INC AXILLA   Other Visit Diagnoses     Screening for colon cancer       Relevant Orders   Ambulatory referral to Gastroenterology       Outpatient Encounter Medications as of 12/29/2022  Medication Sig   doxycycline (VIBRA-TABS) 100 MG tablet Take 1 tablet (100 mg total) by mouth 2 (two) times daily.   rizatriptan (MAXALT) 10 MG tablet Take 1 tablet (10 mg total) by mouth as needed for migraine. May repeat in 2 hours if needed, not to take more than two times in a day.   cyclobenzaprine (FLEXERIL) 10 MG tablet Take 1 tablet (10 mg total) by mouth 3 (three) times daily as needed for muscle spasms. (Patient not taking: Reported on 12/29/2022)   fluticasone (FLONASE) 50 MCG/ACT nasal spray Place 2 sprays into both nostrils daily. (Patient not taking: Reported on 12/29/2022)   levocetirizine (XYZAL) 5 MG tablet Take 1 tablet (5 mg total) by mouth every evening. (Patient not taking: Reported on 12/29/2022)   phentermine 37.5 MG capsule One capsule by mouth qAM (Patient not taking: Reported on 12/29/2022)   No facility-administered encounter medications on file as of 12/29/2022.    Follow-up: Return in about 4 Tig[place]  (around 01/26/2023) for CPE.   Donell Beers, FNP

## 2022-12-29 NOTE — Assessment & Plan Note (Signed)
Smokes about 0.5 pack/day.  Started smoking at age 52  Asked about quitting: confirms that he/she currently smokes cigarettes Advise to quit smoking: Educated about QUITTING to reduce the risk of cancer, cardio and cerebrovascular disease. Assess willingness: Unwilling to quit at this time, but is working on cutting back. Assist with counseling and pharmacotherapy: Counseled for 5 minutes and literature provided. Arrange for follow up: follow up in 1 months and continue to offer help.

## 2022-12-29 NOTE — Assessment & Plan Note (Addendum)
No menses since November 11, 2022 Stated that her menses has been regular prior to then Home pregnancy test was negative I discussed with the patient that she is probably in her perimenopausal stage

## 2022-12-29 NOTE — Patient Instructions (Addendum)
1. Need for influenza vaccination  - Flu vaccine trivalent PF, 6mos and older(Flulaval,Afluria,Fluarix,Fluzone)  2. Screening for colon cancer  - Ambulatory referral to Gastroenterology  3. Screening mammogram for breast cancer  - MM 3D DIAGNOSTIC MAMMOGRAM BILATERAL BREAST please call [phone] to schedule mammogram   It is important that you exercise regularly at least 30 minutes 5 times a week as tolerated  Think about what you will eat, plan ahead. Choose " clean, green, fresh or frozen" over canned, processed or packaged foods which are more sugary, salty and fatty. 70 to 75% of food eaten should be vegetables and fruit. Three meals at set times with snacks allowed between meals, but they must be fruit or vegetables. Aim to eat over a 12 hour period , example 7 am to 7 pm, and STOP after  your last meal of the day. Drink water,generally about 64 ounces per day, no other drink is as healthy. Fruit juice is best enjoyed in a healthy way, by EATING the fruit.  Thanks for choosing [facility] we consider it a privelige to serve you.

## 2022-12-29 NOTE — Assessment & Plan Note (Addendum)
The patient had  Bilateral BILATERAL BREAST DUCTAL EXCISION in 2021. has noticed nipple drainage since about 9 months ago, she denies fever, chills, malaise, tenderness  Left nipple appears moist, no bloody discharge noted, no tenderness on palpation Diagnostic mammogram ordered and [place] today. Doxycycline 100 mg twice daily for 10 days ordered   Results of last mammogram recommendation below  IMPRESSION: The fluid collection on the right may represent a seroma with overlying skin breakdown given the lack of other signs of infection. A low-grade infection is not completely excluded. No cause for left-sided discharge identified. The left-sided discharge is not typical for a papilloma or malignancy. The malodorous nature of the discharge raises the possibility of low-grade infection although there is no erythema or pain. The abnormal lymph node in the right axilla may be reactive given the findings on the right.   RECOMMENDATION: The patient was placed on a 10 day course of doxycycline. Discharge on the left is not typical for a papilloma or malignancy. If it does not resolve with antibiotics, an MRI could be performed if clinical concern persists. Recommend surgical consultation for the patient's symptoms on the right. There are no signs of cancer on the right today. Recommend 3 month follow-up ultrasound of the abnormal right axillary node. If the node does not return to normal, recommend biopsy.

## 2022-12-29 NOTE — Assessment & Plan Note (Addendum)
Lesions consistent with hidradenitis noted under bilateral breast and left axillary area No redness or swelling noted Recommend monitoring for now for new occurrences Patient would like referral to dermatology referral placed Smoking cessation encouraged Patient encouraged to lose weight

## 2023-01-13 ENCOUNTER — Ambulatory Visit
Admission: RE | Admit: 2023-01-13 | Discharge: 2023-01-13 | Disposition: A | Discharge status: Home / Self Care | Payer: 59 | Source: Ambulatory Visit | Attending: Nurse Practitioner | Admitting: Nurse Practitioner

## 2023-01-26 ENCOUNTER — Encounter: Payer: Self-pay | Admitting: Nurse Practitioner

## 2023-01-26 ENCOUNTER — Ambulatory Visit: Payer: 59 | Admitting: Nurse Practitioner

## 2023-01-26 VITALS — BP 129/70 | HR 76 | Temp 97.3°F | Wt 259.2 lb

## 2023-01-26 DIAGNOSIS — Z0000 Encounter for general adult medical examination without abnormal findings: Secondary | ICD-10-CM | POA: Diagnosis not present

## 2023-01-26 DIAGNOSIS — Z23 Encounter for immunization: Secondary | ICD-10-CM | POA: Diagnosis not present

## 2023-01-26 DIAGNOSIS — N6452 Nipple discharge: Secondary | ICD-10-CM

## 2023-01-26 DIAGNOSIS — Z130 Encounter for screening for diseases of the blood and blood-forming organs and certain disorders involving the immune mechanism: Secondary | ICD-10-CM

## 2023-01-26 DIAGNOSIS — Z1321 Encounter for screening for nutritional disorder: Secondary | ICD-10-CM

## 2023-01-26 DIAGNOSIS — F17200 Nicotine dependence, unspecified, uncomplicated: Secondary | ICD-10-CM

## 2023-01-26 DIAGNOSIS — Z1329 Encounter for screening for other suspected endocrine disorder: Secondary | ICD-10-CM

## 2023-01-26 DIAGNOSIS — N898 Other specified noninflammatory disorders of vagina: Secondary | ICD-10-CM | POA: Diagnosis not present

## 2023-01-26 DIAGNOSIS — Z13228 Encounter for screening for other metabolic disorders: Secondary | ICD-10-CM

## 2023-01-26 DIAGNOSIS — E66813 Obesity, class 3: Secondary | ICD-10-CM

## 2023-01-26 MED ORDER — PHENTERMINE HCL 15 MG PO CAPS: 15.0000 mg | ORAL_CAPSULE | ORAL | 0 refills | Status: DC

## 2023-01-26 NOTE — Assessment & Plan Note (Signed)
Wt Readings from Last 3 Encounters:  01/26/23 259 lb 3.2 oz (117.6 kg)  12/29/22 255 lb (115.7 kg)  11/26/21 240 lb 9.6 oz (109.1 kg)   Body mass index is 39.41 kg/m.  Start phentermine 15 mg daily.  Encouraged to report palpitation, hypertension Patient counseled on low-carb modified diet Encouraged engage in regular moderate to vigorous exercise at least 150 minutes weekly as tolerated Benefits of healthy weights discussed

## 2023-01-26 NOTE — Assessment & Plan Note (Signed)

## 2023-01-26 NOTE — Assessment & Plan Note (Signed)
No more  nipple discharge Recent mammogram was normal See result below  Narrative & Impression  CLINICAL DATA:  52 year old with moistness emanating from the LEFT nipple, though the patient cannot confirm a discrete nipple discharge as she is unable to express a discharge. She describes the moistness as tan in color and malodorous. She had a similar episode in 2001 with negative imaging at that time. Annual evaluation, RIGHT breast. She has chronically inverted nipples.   EXAM: DIGITAL DIAGNOSTIC BILATERAL MAMMOGRAM WITH TOMOSYNTHESIS AND CAD; ULTRASOUND LEFT BREAST LIMITED   TECHNIQUE: Bilateral digital diagnostic mammography and breast tomosynthesis was performed. The images were evaluated with computer-aided detection. ; Targeted ultrasound examination of the left breast was performed.   COMPARISON:  Previous exam(s).   ACR Breast Density Category b: There are scattered areas of fibroglandular density.   FINDINGS: Full field CC and MLO views of both breasts and spot compression MLO view of the subareolar LEFT breast were obtained.   RIGHT: No findings suspicious for malignancy.   LEFT: No findings suspicious for malignancy. No mammographic abnormalities in the subareolar location.   Sonographic evaluation of the subareolar location was performed, demonstrating no cyst, solid mass, abnormal acoustic shadowing, duct ectasia, or intraductal mass.   IMPRESSION: 1. No mammographic or sonographic evidence of malignancy involving the LEFT breast. 2. No mammographic evidence of malignancy involving the RIGHT breast.   RECOMMENDATION: 1.  Screening mammogram in one year.(Code:[id number]-B-01Y) 2. If a discrete nipple discharge is ever confirmed, the next imaging study of choice would be BILATERAL Breast MRI without and with contrast.   I have discussed the findings and recommendations with the patient. If applicable, a reminder letter will be sent to the patient regarding the  next appointment.   BI-RADS CATEGORY  1: Negative.     Electronically Signed   By: Hulan Saas M.D.   On: 01/13/2023 11:47

## 2023-01-26 NOTE — Patient Instructions (Signed)
Vaginal odor  - NuSwab Vaginitis Plus (VG+) . Need for shingles vaccine   . Class 3 obesity  - phentermine 15 MG capsule; Take 1 capsule (15 mg total) by mouth every morning.  Dispense: 30 capsule; Refill: 0  It is important that you exercise regularly at least 30 minutes 5 times a week as tolerated  Think about what you will eat, plan ahead. Choose " clean, green, fresh or frozen" over canned, processed or packaged foods which are more sugary, salty and fatty. 70 to 75% of food eaten should be vegetables and fruit. Three meals at set times with snacks allowed between meals, but they must be fruit or vegetables. Aim to eat over a 12 hour period , example 7 am to 7 pm, and STOP after  your last meal of the day. Drink water,generally about 64 ounces per day, no other drink is as healthy. Fruit juice is best enjoyed in a healthy way, by EATING the fruit.  Thanks for choosing [facility] we consider it a privelige to serve you.

## 2023-01-26 NOTE — Assessment & Plan Note (Signed)
Half pack of cigarettes in the last 3 days Patient encouraged to continue to intensify efforts at smoking cessation

## 2023-01-26 NOTE — Progress Notes (Signed)
Complete physical exam  Patient: Lorraine [name]   DOB: 11-08-1970   52 y.o. Female  MRN: [id number]  Subjective:    Chief Complaint  Patient presents with   Annual Exam    Fasting     Lorraine [name] is a 52 y.o. female  has a past medical history of Allergy, Hidradenitis suppurativa (04/02/2011), Seasonal allergies, SVD (spontaneous vaginal delivery), and Tobacco use disorder (12/29/2022). who presents today for a complete physical exam. She reports consuming a general diet. She generally feels well. She reports sleeping well.   Obesity.  Patient was on phentermine in the past that helped her to lose weight.  She has started doing intermittent fasting, and exercises using an app 3 days a week.  She is interested in medication for weight loss.  Vaginal odor patient complains of vaginal odor.  She denies abdominal pain, vaginal discharge, vaginitis.  Tobacco use disorder .half pack of cigarettes now lasts her 3 days  She wears prescription glasses and had eye exam last year  Previous complaint of nipple discharge has resolved.  She has had a mammogram and ultrasound which was normal.  Referral for colon cancer screening is pending.  Most recent fall risk assessment:    11/26/2021    3:55 PM  Fall Risk   Falls in the past year? 0  Number falls in past yr: 0  Injury with Fall? 0  Risk for fall due to : No Fall Risks  Follow up Falls evaluation completed     Most recent depression screenings:    12/29/2022    2:23 PM 11/26/2021    3:55 PM  PHQ 2/9 Scores  PHQ - 2 Score 0 0  PHQ- 9 Score  0  Exception Documentation  Medical reason        Patient Care Team: Donell Beers, FNP as PCP - General (Nurse Practitioner)   Outpatient Medications Prior to Visit  Medication Sig   cyclobenzaprine (FLEXERIL) 10 MG tablet Take 1 tablet (10 mg total) by mouth 3 (three) times daily as needed for muscle spasms. (Patient not taking: Reported on 12/29/2022)    fluticasone (FLONASE) 50 MCG/ACT nasal spray Place 2 sprays into both nostrils daily. (Patient not taking: Reported on 12/29/2022)   levocetirizine (XYZAL) 5 MG tablet Take 1 tablet (5 mg total) by mouth every evening. (Patient not taking: Reported on 12/29/2022)   rizatriptan (MAXALT) 10 MG tablet Take 1 tablet (10 mg total) by mouth as needed for migraine. May repeat in 2 hours if needed, not to take more than two times in a day. (Patient not taking: Reported on 01/26/2023)   [DISCONTINUED] doxycycline (VIBRA-TABS) 100 MG tablet Take 1 tablet (100 mg total) by mouth 2 (two) times daily. (Patient not taking: Reported on 01/26/2023)   [DISCONTINUED] phentermine 37.5 MG capsule One capsule by mouth qAM (Patient not taking: Reported on 12/29/2022)   No facility-administered medications prior to visit.    Review of Systems  Constitutional:  Negative for activity change, appetite change, chills, fatigue and fever.  HENT:  Negative for congestion, dental problem, ear discharge, ear pain, hearing loss, rhinorrhea, sinus pressure, sinus pain, sneezing and sore throat.   Eyes:  Negative for pain, discharge, redness and itching.  Respiratory:  Negative for cough, chest tightness, shortness of breath and wheezing.   Cardiovascular:  Negative for chest pain, palpitations and leg swelling.  Gastrointestinal:  Negative for abdominal distention, abdominal pain, anal bleeding, blood in stool, constipation, diarrhea, nausea, rectal  pain and vomiting.  Endocrine: Negative for cold intolerance, heat intolerance, polydipsia, polyphagia and polyuria.  Genitourinary:  Negative for difficulty urinating, dysuria, flank pain, frequency, hematuria, menstrual problem, pelvic pain and vaginal bleeding.  Musculoskeletal:  Negative for arthralgias, back pain, gait problem, joint swelling and myalgias.  Skin:  Negative for color change, pallor, rash and wound.  Allergic/Immunologic: Negative for food allergies and  immunocompromised state.  Neurological:  Negative for dizziness, tremors, facial asymmetry, weakness and headaches.  Hematological:  Negative for adenopathy. Does not bruise/bleed easily.  Psychiatric/Behavioral:  Negative for agitation, behavioral problems, confusion, decreased concentration, hallucinations, self-injury and suicidal ideas.        Objective:     BP 129/70   Pulse 76   Temp (!) 97.3 F (36.3 C)   Wt 259 lb 3.2 oz (117.6 kg)   LMP 11/11/2022   SpO2 100%   BMI 39.41 kg/m    Physical Exam Vitals and nursing note reviewed. Exam conducted with a chaperone present.  Constitutional:      General: She is not in acute distress.    Appearance: Normal appearance. She is obese. She is not ill-appearing, toxic-appearing or diaphoretic.  HENT:     Right Ear: Tympanic membrane, ear canal and external ear normal. There is no impacted cerumen.     Left Ear: Tympanic membrane, ear canal and external ear normal. There is no impacted cerumen.     Nose: Nose normal. No congestion or rhinorrhea.     Mouth/Throat:     Mouth: Mucous membranes are moist.     Pharynx: Oropharynx is clear. No oropharyngeal exudate or posterior oropharyngeal erythema.  Eyes:     General: No scleral icterus.       Right eye: No discharge.        Left eye: No discharge.     Extraocular Movements: Extraocular movements intact.     Conjunctiva/sclera: Conjunctivae normal.  Neck:     Vascular: No carotid bruit.  Cardiovascular:     Rate and Rhythm: Normal rate and regular rhythm.     Pulses: Normal pulses.     Heart sounds: Normal heart sounds. No murmur heard.    No friction rub. No gallop.  Pulmonary:     Effort: Pulmonary effort is normal. No respiratory distress.     Breath sounds: Normal breath sounds. No stridor. No wheezing, rhonchi or rales.  Chest:     Chest wall: No tenderness.  Abdominal:     General: Bowel sounds are normal. There is no distension.     Palpations: Abdomen is soft.  There is no mass.     Tenderness: There is no abdominal tenderness. There is no right CVA tenderness, left CVA tenderness, guarding or rebound.     Hernia: No hernia is present.  Musculoskeletal:        General: No swelling, tenderness, deformity or signs of injury.     Cervical back: Normal range of motion and neck supple. No rigidity or tenderness.     Right lower leg: No edema.     Left lower leg: No edema.  Lymphadenopathy:     Cervical: No cervical adenopathy.  Skin:    General: Skin is warm and dry.     Capillary Refill: Capillary refill takes less than 2 seconds.     Coloration: Skin is not jaundiced or pale.     Findings: No bruising, erythema, lesion or rash.  Neurological:     Mental Status: She is alert and oriented  to person, place, and time.     Cranial Nerves: No cranial nerve deficit.     Sensory: No sensory deficit.     Motor: No weakness.     Coordination: Coordination normal.     Gait: Gait normal.     Deep Tendon Reflexes: Reflexes normal.  Psychiatric:        Mood and Affect: Mood normal.        Behavior: Behavior normal.        Thought Content: Thought content normal.        Judgment: Judgment normal.     No results found for any visits on 01/26/23.     Assessment & Plan:    Routine Health Maintenance and Physical Exam  Immunization History  Administered Date(s) Administered   Influenza, Seasonal, Injecte, Preservative Fre 12/29/2022   Influenza,inj,Quad PF,6+ Mos 12/22/2016, 01/26/2018, 11/10/2018, 12/28/2020   Influenza-Unspecified 12/22/2016, 01/26/2018, 11/10/2018   PFIZER(Purple Top)SARS-COV-2 Vaccination 03/25/2019, 04/25/2019   Pneumococcal Polysaccharide-23 10/29/2015   Pneumococcal-Unspecified 10/29/2015   Tdap 11/10/2018   Zoster Recombinant(Shingrix) 01/26/2023    Health Maintenance  Topic Date Due   Hepatitis C Screening  Never done   Colonoscopy  Never done   COVID-19 Vaccine (3 - 2023-24 season) 11/09/2022   Zoster Vaccines-  Shingrix (2 of 2) 03/23/2023   Cervical Cancer Screening (HPV/Pap Cotest)  11/10/2023   MAMMOGRAM  01/12/2025   DTaP/Tdap/Td (2 - Td or Tdap) 11/09/2028   INFLUENZA VACCINE  Completed   HIV Screening  Completed   HPV VACCINES  Aged Out    Discussed health benefits of physical activity, and encouraged her to engage in regular exercise appropriate for her age and condition.  Problem List Items Addressed This Visit       Other   Class 3 obesity    Wt Readings from Last 3 Encounters:  01/26/23 259 lb 3.2 oz (117.6 kg)  12/29/22 255 lb (115.7 kg)  11/26/21 240 lb 9.6 oz (109.1 kg)   Body mass index is 39.41 kg/m.  Start phentermine 15 mg daily.  Encouraged to report palpitation, hypertension Patient counseled on low-carb modified diet Encouraged engage in regular moderate to vigorous exercise at least 150 minutes weekly as tolerated Benefits of healthy weights discussed      Relevant Medications   phentermine 15 MG capsule   Vaginal odor    Vaginal swab self collected medication - NuSwab Vaginitis Plus (VG+)       Relevant Orders   NuSwab Vaginitis Plus (VG+)   Tobacco use disorder    Half pack of cigarettes in the last 3 days Patient encouraged to continue to intensify efforts at smoking cessation      Nipple discharge    No more  nipple discharge Recent mammogram was normal See result below  Narrative & Impression  CLINICAL DATA:  52 year old with moistness emanating from the LEFT nipple, though the patient cannot confirm a discrete nipple discharge as she is unable to express a discharge. She describes the moistness as tan in color and malodorous. She had a similar episode in 2001 with negative imaging at that time. Annual evaluation, RIGHT breast. She has chronically inverted nipples.   EXAM: DIGITAL DIAGNOSTIC BILATERAL MAMMOGRAM WITH TOMOSYNTHESIS AND CAD; ULTRASOUND LEFT BREAST LIMITED   TECHNIQUE: Bilateral digital diagnostic mammography and breast  tomosynthesis was performed. The images were evaluated with computer-aided detection. ; Targeted ultrasound examination of the left breast was performed.   COMPARISON:  Previous exam(s).   ACR Breast Density Category  b: There are scattered areas of fibroglandular density.   FINDINGS: Full field CC and MLO views of both breasts and spot compression MLO view of the subareolar LEFT breast were obtained.   RIGHT: No findings suspicious for malignancy.   LEFT: No findings suspicious for malignancy. No mammographic abnormalities in the subareolar location.   Sonographic evaluation of the subareolar location was performed, demonstrating no cyst, solid mass, abnormal acoustic shadowing, duct ectasia, or intraductal mass.   IMPRESSION: 1. No mammographic or sonographic evidence of malignancy involving the LEFT breast. 2. No mammographic evidence of malignancy involving the RIGHT breast.   RECOMMENDATION: 1.  Screening mammogram in one year.(Code:SM-B-01Y) 2. If a discrete nipple discharge is ever confirmed, the next imaging study of choice would be BILATERAL Breast MRI without and with contrast.   I have discussed the findings and recommendations with the patient. If applicable, a reminder letter will be sent to the patient regarding the next appointment.   BI-RADS CATEGORY  1: Negative.     Electronically Signed   By: Hulan Saas M.D.   On: 01/13/2023 11:47              Need for shingles vaccine    Patient educated on CDC recommendation for the vaccine. Verbal consent was obtained from the patient, vaccine administered by nurse, no sign of adverse reactions noted at this time. Patient education on arm soreness and use of tylenol for this patient  was discussed. Patient educated on the signs and symptoms of adverse effect and advise to contact the office if they occur. Vaccine information sheet given to patient.        Relevant Orders   Varicella-zoster vaccine  IM (Completed)   Annual physical exam - Primary    Annual exam as documented.  Counseling done include healthy lifestyle involving committing to 150 minutes of exercise per week, heart healthy diet, and attaining healthy weight. The importance of adequate sleep also discussed.  Regular use of seat belt and home safety were also discussed . Changes in health habits are decided on by patient with goals and time frames set for achieving them. Immunization and cancer screening  needs are specifically addressed at this visit.    Routine fasting labs ordered Referral for colonoscopy is pending      Other Visit Diagnoses     Screening for endocrine, nutritional, metabolic and immunity disorder       Relevant Orders   CBC   CMP14+EGFR   Hepatitis C antibody   Lipid panel      Return in about 4 [employer] (around 02/23/2023) for obesity.     Donell Beers, FNP

## 2023-01-26 NOTE — Assessment & Plan Note (Addendum)
Annual exam as documented.  Counseling done include healthy lifestyle involving committing to 150 minutes of exercise per week, heart healthy diet, and attaining healthy weight. The importance of adequate sleep also discussed.  Regular use of seat belt and home safety were also discussed . Changes in health habits are decided on by patient with goals and time frames set for achieving them. Immunization and cancer screening  needs are specifically addressed at this visit.    Routine fasting labs ordered Referral for colonoscopy is pending

## 2023-01-26 NOTE — Assessment & Plan Note (Signed)
Vaginal swab self collected medication - NuSwab Vaginitis Plus (VG+)

## 2023-01-27 LAB — CBC
Hematocrit: 40.9 % (ref 34.0–46.6)
Hemoglobin: 13.6 g/dL (ref 11.1–15.9)
MCH: 30.5 pg (ref 26.6–33.0)
MCHC: 33.3 g/dL (ref 31.5–35.7)
MCV: 92 fL (ref 79–97)
Platelets: 302 10*3/uL (ref 150–450)
RBC: 4.46 x10E6/uL (ref 3.77–5.28)
RDW: 11.5 % — ABNORMAL LOW (ref 11.7–15.4)
WBC: 5 10*3/uL (ref 3.4–10.8)

## 2023-01-27 LAB — CMP14+EGFR
ALT: 10 [IU]/L (ref 0–32)
AST: 12 [IU]/L (ref 0–40)
Albumin: 3.9 g/dL (ref 3.9–4.9)
Alkaline Phosphatase: 55 [IU]/L (ref 44–121)
BUN/Creatinine Ratio: 14 (ref 9–23)
BUN: 12 mg/dL (ref 6–24)
Bilirubin Total: 0.3 mg/dL (ref 0.0–1.2)
CO2: 23 mmol/L (ref 20–29)
Calcium: 9.1 mg/dL (ref 8.7–10.2)
Chloride: 105 mmol/L (ref 96–106)
Creatinine, Ser: 0.88 mg/dL (ref 0.57–1.00)
Globulin, Total: 2.3 g/dL (ref 1.5–4.5)
Glucose: 90 mg/dL (ref 70–99)
Potassium: 4.1 mmol/L (ref 3.5–5.2)
Sodium: 141 mmol/L (ref 134–144)
Total Protein: 6.2 g/dL (ref 6.0–8.5)
eGFR: 80 mL/min/{1.73_m2} (ref >59)

## 2023-01-27 LAB — HEPATITIS C ANTIBODY: Hep C Virus Ab: NONREACTIVE

## 2023-01-27 LAB — LIPID PANEL
Chol/HDL Ratio: 2.5 ratio (ref 0.0–4.4)
Cholesterol, Total: 163 mg/dL (ref 100–199)
HDL: 64 mg/dL (ref >39)
LDL Chol Calc (NIH): 87 mg/dL (ref 0–99)
Triglycerides: 58 mg/dL (ref 0–149)
VLDL Cholesterol Cal: 12 mg/dL (ref 5–40)

## 2023-01-29 LAB — NUSWAB VAGINITIS PLUS (VG+)
Atopobium vaginae: HIGH {score} — AB
Candida albicans, NAA: NEGATIVE
Candida glabrata, NAA: NEGATIVE
Chlamydia trachomatis, NAA: NEGATIVE
Megasphaera 1: HIGH {score} — AB
Neisseria gonorrhoeae, NAA: NEGATIVE
Trich vag by NAA: NEGATIVE

## 2023-01-30 ENCOUNTER — Other Ambulatory Visit (HOSPITAL_COMMUNITY): Payer: Self-pay | Admitting: Nurse Practitioner

## 2023-01-30 DIAGNOSIS — B9689 Other specified bacterial agents as the cause of diseases classified elsewhere: Secondary | ICD-10-CM

## 2023-01-30 MED ORDER — METRONIDAZOLE 500 MG PO TABS: 500.0000 mg | ORAL_TABLET | Freq: Two times a day (BID) | ORAL | 0 refills | Status: AC

## 2023-02-23 ENCOUNTER — Encounter: Payer: Self-pay | Admitting: Nurse Practitioner

## 2023-02-23 ENCOUNTER — Ambulatory Visit (INDEPENDENT_AMBULATORY_CARE_PROVIDER_SITE_OTHER): Payer: 59 | Admitting: Nurse Practitioner

## 2023-02-23 VITALS — BP 119/64 | HR 87 | Temp 97.0°F | Ht 68.0 in | Wt 256.0 lb

## 2023-02-23 DIAGNOSIS — E66813 Obesity, class 3: Secondary | ICD-10-CM | POA: Diagnosis not present

## 2023-02-23 DIAGNOSIS — F17200 Nicotine dependence, unspecified, uncomplicated: Secondary | ICD-10-CM

## 2023-02-23 DIAGNOSIS — M545 Low back pain, unspecified: Secondary | ICD-10-CM | POA: Insufficient documentation

## 2023-02-23 MED ORDER — PHENTERMINE HCL 37.5 MG PO CAPS: 37.5000 mg | ORAL_CAPSULE | ORAL | 0 refills | Status: DC

## 2023-02-23 NOTE — Assessment & Plan Note (Addendum)
Wt Readings from Last 3 Encounters:  02/23/23 256 lb (116.1 kg)  01/26/23 259 lb 3.2 oz (117.6 kg)  12/29/22 255 lb (115.7 kg)   Body mass index is 38.92 kg/m.  Patient has lost 3 pounds since our last visit With increased phentermine to 37.5 mg daily Patient counseled on low-carb diet Encouraged to engage in regular moderate to vigorous exercises at least 150 minutes weekly Follow-up in 3 months

## 2023-02-23 NOTE — Assessment & Plan Note (Signed)
Continues to smoke half pack of cigarettes in 3 days Smoking cessation encouraged

## 2023-02-23 NOTE — Patient Instructions (Signed)
1. Class 3 obesity (Primary)  - phentermine 37.5 MG capsule; Take 1 capsule (37.5 mg total) by mouth every morning.  Dispense: 30 capsule; Refill: 0  2. Tobacco user   3. Acute midline low back pain without sciatica   It is important that you exercise regularly at least 30 minutes 5 times a week as tolerated  Think about what you will eat, plan ahead. Choose " clean, green, fresh or frozen" over canned, processed or packaged foods which are more sugary, salty and fatty. 70 to 75% of food eaten should be vegetables and fruit. Three meals at set times with snacks allowed between meals, but they must be fruit or vegetables. Aim to eat over a 12 hour period , example 7 am to 7 pm, and STOP after  your last meal of the day. Drink water,generally about 64 ounces per day, no other drink is as healthy. Fruit juice is best enjoyed in a healthy way, by EATING the fruit.  Thanks for choosing [facility] we consider it a privelige to serve you.

## 2023-02-23 NOTE — Progress Notes (Signed)
Established Patient Office Visit  Subjective:  Patient ID: Lorraine [name], female    DOB: 1970/05/28  Age: 52 y.o. MRN: [id number]  CC:  Chief Complaint  Patient presents with   Obesity    HPI Lorraine [name] is a 52 y.o. female  has a past medical history of Allergy, Class 3 obesity (04/02/2011), Hidradenitis suppurativa (04/02/2011), Seasonal allergies, SVD (spontaneous vaginal delivery), and Tobacco use disorder (12/29/2022).   Obesity.  Currently on phentermine 15 mg daily, patient denies any adverse reaction to this medication.  She has been exercising 3 days a week via a vaginal reality program ,also doing better with her diet   Acute mid low back pain without sciatica.  Patient complains of intermittent sharp low back pain that started about 2 [place] ago, she has not identified anything that aggravates or relieves, she denies trauma numbness, stiffness ,tingling urinary or bowel  incontinence.  Phone number to GI office provided for the patient to schedule an appointment for colonoscopy    Past Medical History:  Diagnosis Date   Allergy    Seasonal   Class 3 obesity 04/02/2011   Hidradenitis suppurativa 04/02/2011   Seasonal allergies    SVD (spontaneous vaginal delivery)    x 1   Tobacco use disorder 12/29/2022    Past Surgical History:  Procedure Laterality Date   BREAST DUCTAL SYSTEM EXCISION Bilateral 10/31/2019   Procedure: BILATERAL BREAST DUCTAL EXCISION;  Surgeon: Abigail Miyamoto, MD;  Location: [facility];  Service: General;  Laterality: Bilateral;  LMA   CHOLECYSTECTOMY     MASS EXCISION Right 08/19/2018   Procedure: EXCISION CHRONIC RIGHT BREAST ABSCESS;  Surgeon: Abigail Miyamoto, MD;  Location: [facility] OR;  Service: General;  Laterality: Right;   PILONIDAL CYST EXCISION     WISDOM TOOTH EXTRACTION      Family History  Problem Relation Age of Onset   Heart disease Father    Diabetes Father    Thyroid disease Mother         Pituitary tumor   Kidney disease Mother        renal tumor- benign   Thyroid disease Sister    Diabetes Paternal Aunt    Diabetes Paternal Uncle    Diabetes Maternal Grandmother     Social History   Socioeconomic History   Marital status: Significant Other    Spouse name: Not on file   Number of children: 1   Years of education: Not on file   Highest education level: Not on file  Occupational History   Not on file  Tobacco Use   Smoking status: Every Day    Current packs/day: 0.50    Average packs/day: 0.5 packs/day for 24.0 years (12.0 ttl pk-yrs)    Types: Cigarettes   Smokeless tobacco: Never  Vaping Use   Vaping status: Never Used  Substance and Sexual Activity   Alcohol use: Yes    Alcohol/week: 1.0 standard drink of alcohol    Types: 1 Glasses of wine per week    Comment: SOCIALLY (TWICE A MONTH)   Drug use: No   Sexual activity: Yes    Birth control/protection: Condom  Other Topics Concern   Not on file  Social History Narrative   Lives with her boyfriend and daughter    Social Drivers of Corporate investment banker Strain: Not on file  Food Insecurity: No Food Insecurity (09/18/2020)   Received from [facility], [facility]   Hunger Vital Sign  Worried About Programme researcher, broadcasting/film/video in the Last Year: Never true    Ran Out of Food in the Last Year: Never true  Transportation Needs: Not on file  Physical Activity: Insufficiently Active (10/01/2017)   Exercise Vital Sign    Days of Exercise per Week: 2 days    Minutes of Exercise per Session: 30 min  Stress: Not on file  Social Connections: Unknown (07/23/2021)   Received from [facility], [facility]   Social Network    Social Network: Not on file  Intimate Partner Violence: Unknown (06/14/2021)   [facility], [facility]   HITS    Physically Hurt: Not on file    Insult or Talk Down To: Not on file    Threaten Physical Harm: Not on file    Scream or Curse: Not on file     Outpatient Medications Prior to Visit  Medication Sig Dispense Refill   phentermine 15 MG capsule Take 1 capsule (15 mg total) by mouth every morning. 30 capsule 0   cyclobenzaprine (FLEXERIL) 10 MG tablet Take 1 tablet (10 mg total) by mouth 3 (three) times daily as needed for muscle spasms. (Patient not taking: Reported on 02/23/2023) 10 tablet 0   fluticasone (FLONASE) 50 MCG/ACT nasal spray Place 2 sprays into both nostrils daily. (Patient not taking: Reported on 02/23/2023) 16 g 0   levocetirizine (XYZAL) 5 MG tablet Take 1 tablet (5 mg total) by mouth every evening. (Patient not taking: Reported on 02/23/2023) 30 tablet 01   rizatriptan (MAXALT) 10 MG tablet Take 1 tablet (10 mg total) by mouth as needed for migraine. May repeat in 2 hours if needed, not to take more than two times in a day. (Patient not taking: Reported on 02/23/2023) 10 tablet 0   No facility-administered medications prior to visit.    Allergies  Allergen Reactions   Penicillins Other (See Comments)    Did it involve swelling of the face/tongue/throat, SOB, or low BP? Unknown Did it involve sudden or severe rash/hives, skin peeling, or any reaction on the inside of your mouth or nose? Unknown Did you need to seek medical attention at a hospital or doctor's office? Unknown When did it last happen?childhood reaction If all above answers are "NO", may proceed with cephalosporin use.     ROS Review of Systems  Constitutional:  Negative for appetite change, chills, fatigue and fever.  HENT:  Negative for congestion, postnasal drip, rhinorrhea and sneezing.   Respiratory:  Negative for cough, shortness of breath and wheezing.   Cardiovascular:  Negative for chest pain, palpitations and leg swelling.  Gastrointestinal:  Negative for abdominal pain, constipation, nausea and vomiting.  Genitourinary:  Negative for difficulty urinating, dysuria, flank pain and frequency.  Musculoskeletal:  Positive for arthralgias.  Negative for back pain, joint swelling and myalgias.  Skin:  Negative for color change, pallor, rash and wound.  Neurological:  Negative for dizziness, facial asymmetry, weakness, numbness and headaches.  Psychiatric/Behavioral:  Negative for behavioral problems, confusion, self-injury and suicidal ideas.       Objective:    Physical Exam Vitals and nursing note reviewed.  Constitutional:      General: She is not in acute distress.    Appearance: Normal appearance. She is obese. She is not ill-appearing, toxic-appearing or diaphoretic.  HENT:     Mouth/Throat:     Mouth: Mucous membranes are moist.     Pharynx: Oropharynx is clear. No oropharyngeal exudate or posterior oropharyngeal erythema.  Eyes:     General: No scleral icterus.       Right eye: No discharge.        Left eye: No discharge.     Extraocular Movements: Extraocular movements intact.     Conjunctiva/sclera: Conjunctivae normal.  Cardiovascular:     Rate and Rhythm: Normal rate and regular rhythm.     Pulses: Normal pulses.     Heart sounds: Normal heart sounds. No murmur heard.    No friction rub. No gallop.  Pulmonary:     Effort: Pulmonary effort is normal. No respiratory distress.     Breath sounds: Normal breath sounds. No stridor. No wheezing, rhonchi or rales.  Chest:     Chest wall: No tenderness.  Abdominal:     General: There is no distension.     Palpations: Abdomen is soft.     Tenderness: There is no abdominal tenderness. There is no right CVA tenderness, left CVA tenderness or guarding.  Musculoskeletal:        General: No swelling, tenderness, deformity or signs of injury.     Right lower leg: No edema.     Left lower leg: No edema.     Comments: No tenderness noted on palpation of mid low back area, skin warm and dry no redness or swelling noted  Skin:    General: Skin is warm and dry.     Capillary Refill: Capillary refill takes less than 2 seconds.     Coloration: Skin is not jaundiced or  pale.     Findings: No bruising, erythema or lesion.  Neurological:     Mental Status: She is alert and oriented to person, place, and time.     Motor: No weakness.     Coordination: Coordination normal.     Gait: Gait normal.  Psychiatric:        Mood and Affect: Mood normal.        Behavior: Behavior normal.        Thought Content: Thought content normal.        Judgment: Judgment normal.     BP 119/64   Pulse 87   Temp (!) 97 F (36.1 C)   Ht 5\' 8"  (1.727 m)   Wt 256 lb (116.1 kg)   LMP 11/11/2022   SpO2 100%   BMI 38.92 kg/m  Wt Readings from Last 3 Encounters:  02/23/23 256 lb (116.1 kg)  01/26/23 259 lb 3.2 oz (117.6 kg)  12/29/22 255 lb (115.7 kg)    Lab Results  Component Value Date   TSH 1.080 11/26/2021   Lab Results  Component Value Date   WBC 5.0 01/26/2023   HGB 13.6 01/26/2023   HCT 40.9 01/26/2023   MCV 92 01/26/2023   PLT 302 01/26/2023   Lab Results  Component Value Date   NA 141 01/26/2023   K 4.1 01/26/2023   CO2 23 01/26/2023   GLUCOSE 90 01/26/2023   BUN 12 01/26/2023   CREATININE 0.88 01/26/2023   BILITOT 0.3 01/26/2023   ALKPHOS 55 01/26/2023   AST 12 01/26/2023   ALT 10 01/26/2023   PROT 6.2 01/26/2023   ALBUMIN 3.9 01/26/2023   CALCIUM 9.1 01/26/2023   EGFR 80 01/26/2023   Lab Results  Component Value Date   CHOL 163 01/26/2023   Lab Results  Component Value Date   HDL 64 01/26/2023   Lab Results  Component Value Date   LDLCALC 87 01/26/2023   Lab Results  Component  Value Date   TRIG 58 01/26/2023   Lab Results  Component Value Date   CHOLHDL 2.5 01/26/2023   Lab Results  Component Value Date   HGBA1C 5.1 06/29/2019      Assessment & Plan:   Problem List Items Addressed This Visit       Other   Class 3 obesity - Primary   Wt Readings from Last 3 Encounters:  02/23/23 256 lb (116.1 kg)  01/26/23 259 lb 3.2 oz (117.6 kg)  12/29/22 255 lb (115.7 kg)   Body mass index is 38.92 kg/m.  Patient has  lost 3 pounds since our last visit With increased phentermine to 37.5 mg daily Patient counseled on low-carb diet Encouraged to engage in regular moderate to vigorous exercises at least 150 minutes weekly Follow-up in 3 months      Relevant Medications   phentermine 37.5 MG capsule (Start on 02/25/2023)   Tobacco use disorder   Continues to smoke half pack of cigarettes in 3 days Smoking cessation encouraged      Acute midline low back pain without sciatica   Currently denies back pain Stretching exercises encouraged, may take OTC Tylenol or ibuprofen as needed, application of heating pad also encouraged       Meds ordered this encounter  Medications   phentermine 37.5 MG capsule    Sig: Take 1 capsule (37.5 mg total) by mouth every morning.    Dispense:  30 capsule    Refill:  0    Follow-up: Return in about 3 months (around 05/24/2023) for obesity.    Donell Beers, FNP

## 2023-02-23 NOTE — Assessment & Plan Note (Signed)
Currently denies back pain Stretching exercises encouraged, may take OTC Tylenol or ibuprofen as needed, application of heating pad also encouraged

## 2023-05-04 ENCOUNTER — Other Ambulatory Visit: Payer: Self-pay

## 2023-05-04 ENCOUNTER — Ambulatory Visit: Payer: 59 | Admitting: Nurse Practitioner

## 2023-05-04 ENCOUNTER — Encounter: Payer: Self-pay | Admitting: Nurse Practitioner

## 2023-05-04 ENCOUNTER — Other Ambulatory Visit: Payer: Self-pay | Admitting: Nurse Practitioner

## 2023-05-04 VITALS — BP 127/75 | HR 76 | Temp 97.0°F | Wt 255.0 lb

## 2023-05-04 DIAGNOSIS — Z1211 Encounter for screening for malignant neoplasm of colon: Secondary | ICD-10-CM

## 2023-05-04 DIAGNOSIS — E66813 Obesity, class 3: Secondary | ICD-10-CM

## 2023-05-04 DIAGNOSIS — F17200 Nicotine dependence, unspecified, uncomplicated: Secondary | ICD-10-CM

## 2023-05-04 MED ORDER — TIRZEPATIDE 2.5 MG/0.5ML ~~LOC~~ SOAJ: 2.5000 mg | SUBCUTANEOUS | 0 refills | Status: DC

## 2023-05-04 MED ORDER — PHENTERMINE HCL 37.5 MG PO CAPS: 37.5000 mg | ORAL_CAPSULE | ORAL | 0 refills | Status: DC

## 2023-05-04 NOTE — Progress Notes (Unsigned)
 PDMP reviewed no inconsistencies noted  1. Class 3 obesity  - phentermine 37.5 MG capsule; Take 1 capsule (37.5 mg total) by mouth every morning.  Dispense: 30 capsule; Refill: 0

## 2023-05-04 NOTE — Telephone Encounter (Signed)
 Please advise [facility]

## 2023-05-04 NOTE — Assessment & Plan Note (Addendum)
 Smokes about 7 cigarettes /day  Asked about quitting: confirms that he/she currently smokes cigarettes Advise to quit smoking: Educated about QUITTING to reduce the risk of cancer, cardio and cerebrovascular disease. Assess willingness: Unwilling to quit at this time, but is working on cutting back. Assist with counseling and pharmacotherapy: Counseled for 5 minutes and literature provided. Arrange for follow up: follow up in 1 month and continue to offer help.

## 2023-05-04 NOTE — Assessment & Plan Note (Addendum)
 Wt Readings from Last 3 Encounters:  05/04/23 255 lb (115.7 kg)  02/23/23 256 lb (116.1 kg)  01/26/23 259 lb 3.2 oz (117.6 kg)   Body mass index is 38.77 kg/m.  Patient has lost 1 pound since her last visit She would like to try Mounjaro, Mounjaro 0.25 mg once weekly injection ordered, phentermine discontinued Patient denies personal or family history of MTC or MEN 2.  They denied personal history of pancreatitis.  Patient encouraged to avoid fatty fried foods eat smaller portions of meal to help decrease nausea.  Encouraged to report abdominal pain, nausea, vomiting.  Patient counseled on low-carb diet, encouraged to engage in regular moderate to vigorous exercise at least 150 minutes weekly Follow-up in 4 [place]

## 2023-05-04 NOTE — Telephone Encounter (Signed)
Pt was made an appt. Kh

## 2023-05-04 NOTE — Patient Instructions (Signed)
 1. Class 3 obesity (Primary)  - tirzepatide (MOUNJARO) 2.5 MG/0.5ML Pen; Inject 2.5 mg into the skin once a week.  Dispense: 2 mL; Refill: 0    It is important that you exercise regularly at least 30 minutes 5 times a week as tolerated  Think about what you will eat, plan ahead. Choose " clean, green, fresh or frozen" over canned, processed or packaged foods which are more sugary, salty and fatty. 70 to 75% of food eaten should be vegetables and fruit. Three meals at set times with snacks allowed between meals, but they must be fruit or vegetables. Aim to eat over a 12 hour period , example 7 am to 7 pm, and STOP after  your last meal of the day. Drink water,generally about 64 ounces per day, no other drink is as healthy. Fruit juice is best enjoyed in a healthy way, by EATING the fruit.  Thanks for choosing [facility] we consider it a privelige to serve you.

## 2023-05-04 NOTE — Progress Notes (Signed)
 Established Patient Office Visit  Subjective:  Patient ID: Lorraine [name], female    DOB: [birth year]  Age: 53 y.o. MRN: [id number]  CC:  Chief Complaint  Patient presents with   Weight Loss    HPI Lorraine [name] is a 53 y.o. female  has a past medical history of Allergy, Class 3 obesity (04/02/2011), Hidradenitis suppurativa (04/02/2011), Seasonal allergies, SVD (spontaneous vaginal delivery), and Tobacco use disorder (12/29/2022).  Patient presents for follow-up for obesity  Obesity.  Currently on phentermine 37.5 mg once daily.  Stated that she has been doing a medium intensity exercise 3 days a week for 25 to 45 minutes on each days using a 3D virtual reality headset.  She plans on starting going to the gym soon .she confirmed that her diet can be better loves bread and soda.  She would like to try a GLP-1 feels like phentermine is not helping her to lose weight  Due for shingles vaccine and pneumococcal vaccine need for both vaccines discussed, she plans to get the vaccines at her next visit.  New referral sent to GI for colon cancer screening as they were unable to reach her to schedule an appointment.  I also provided the office number today    Past Medical History:  Diagnosis Date   Allergy    Seasonal   Class 3 obesity 04/02/2011   Hidradenitis suppurativa 04/02/2011   Seasonal allergies    SVD (spontaneous vaginal delivery)    x 1   Tobacco use disorder 12/29/2022    Past Surgical History:  Procedure Laterality Date   BREAST DUCTAL SYSTEM EXCISION Bilateral 10/31/2019   Procedure: BILATERAL BREAST DUCTAL EXCISION;  Surgeon: Abigail Miyamoto, MD;  Location: [facility];  Service: General;  Laterality: Bilateral;  LMA   CHOLECYSTECTOMY     MASS EXCISION Right 08/19/2018   Procedure: EXCISION CHRONIC RIGHT BREAST ABSCESS;  Surgeon: Abigail Miyamoto, MD;  Location: [facility] OR;  Service: General;  Laterality: Right;   PILONIDAL CYST EXCISION      WISDOM TOOTH EXTRACTION      Family History  Problem Relation Age of Onset   Heart disease Father    Diabetes Father    Thyroid disease Mother        Pituitary tumor   Kidney disease Mother        renal tumor- benign   Thyroid disease Sister    Diabetes Paternal Aunt    Diabetes Paternal Uncle    Diabetes Maternal Grandmother     Social History   Socioeconomic History   Marital status: Significant Other    Spouse name: Not on file   Number of children: 1   Years of education: Not on file   Highest education level: Not on file  Occupational History   Not on file  Tobacco Use   Smoking status: Every Day    Current packs/day: 0.50    Average packs/day: 0.5 packs/day for 24.0 years (12.0 ttl pk-yrs)    Types: Cigarettes   Smokeless tobacco: Never  Vaping Use   Vaping status: Never Used  Substance and Sexual Activity   Alcohol use: Yes    Alcohol/week: 1.0 standard drink of alcohol    Types: 1 Glasses of wine per week    Comment: SOCIALLY (TWICE A MONTH)   Drug use: No   Sexual activity: Yes    Birth control/protection: Condom  Other Topics Concern   Not on file  Social History Narrative   Lives  with her boyfriend and daughter    Social Drivers of Corporate investment banker Strain: Not on file  Food Insecurity: No Food Insecurity (09/18/2020)   Received from [facility], [facility]   Hunger Vital Sign    Worried About Running Out of Food in the Last Year: Never true    Ran Out of Food in the Last Year: Never true  Transportation Needs: Not on file  Physical Activity: Insufficiently Active (10/01/2017)   Exercise Vital Sign    Days of Exercise per Week: 2 days    Minutes of Exercise per Session: 30 min  Stress: Not on file  Social Connections: Unknown (07/23/2021)   Received from [facility], [facility]   Social Network    Social Network: Not on file  Intimate Partner Violence: Unknown (06/14/2021)   Received from [facility], [facility]   HITS     Physically Hurt: Not on file    Insult or Talk Down To: Not on file    Threaten Physical Harm: Not on file    Scream or Curse: Not on file    Outpatient Medications Prior to Visit  Medication Sig Dispense Refill   cyclobenzaprine (FLEXERIL) 10 MG tablet Take 1 tablet (10 mg total) by mouth 3 (three) times daily as needed for muscle spasms. (Patient not taking: Reported on 12/29/2022) 10 tablet 0   fluticasone (FLONASE) 50 MCG/ACT nasal spray Place 2 sprays into both nostrils daily. (Patient not taking: Reported on 12/29/2022) 16 g 0   levocetirizine (XYZAL) 5 MG tablet Take 1 tablet (5 mg total) by mouth every evening. (Patient not taking: Reported on 12/29/2022) 30 tablet 01   rizatriptan (MAXALT) 10 MG tablet Take 1 tablet (10 mg total) by mouth as needed for migraine. May repeat in 2 hours if needed, not to take more than two times in a day. (Patient not taking: Reported on 01/26/2023) 10 tablet 0   phentermine 37.5 MG capsule Take 1 capsule (37.5 mg total) by mouth every morning. (Patient not taking: Reported on 05/04/2023) 30 capsule 0   No facility-administered medications prior to visit.    Allergies  Allergen Reactions   Penicillins Other (See Comments)    Did it involve swelling of the face/tongue/throat, SOB, or low BP? Unknown Did it involve sudden or severe rash/hives, skin peeling, or any reaction on the inside of your mouth or nose? Unknown Did you need to seek medical attention at a hospital or doctor's office? Unknown When did it last happen?childhood reaction If all above answers are "NO", may proceed with cephalosporin use.     ROS Review of Systems  Constitutional:  Negative for appetite change, chills, fatigue and fever.  HENT:  Negative for congestion, postnasal drip, rhinorrhea and sneezing.   Respiratory:  Negative for cough, shortness of breath and wheezing.   Cardiovascular:  Negative for chest pain, palpitations and leg swelling.  Gastrointestinal:   Negative for abdominal pain, constipation, nausea and vomiting.  Genitourinary:  Negative for difficulty urinating, dysuria, flank pain and frequency.  Musculoskeletal:  Negative for arthralgias, back pain, joint swelling and myalgias.  Skin:  Negative for color change, pallor, rash and wound.  Neurological:  Negative for dizziness, facial asymmetry, weakness, numbness and headaches.  Psychiatric/Behavioral:  Negative for behavioral problems, confusion, self-injury and suicidal ideas.       Objective:    Physical Exam Vitals and nursing note reviewed.  Constitutional:      General: She is not in acute distress.  Appearance: Normal appearance. She is obese. She is not ill-appearing, toxic-appearing or diaphoretic.  HENT:     Mouth/Throat:     Mouth: Mucous membranes are moist.     Pharynx: Oropharynx is clear. No oropharyngeal exudate or posterior oropharyngeal erythema.  Eyes:     General: No scleral icterus.       Right eye: No discharge.        Left eye: No discharge.     Extraocular Movements: Extraocular movements intact.     Conjunctiva/sclera: Conjunctivae normal.  Cardiovascular:     Rate and Rhythm: Normal rate and regular rhythm.     Pulses: Normal pulses.     Heart sounds: Normal heart sounds. No murmur heard.    No friction rub. No gallop.  Pulmonary:     Effort: Pulmonary effort is normal. No respiratory distress.     Breath sounds: Normal breath sounds. No stridor. No wheezing, rhonchi or rales.  Chest:     Chest wall: No tenderness.  Abdominal:     General: There is no distension.     Palpations: Abdomen is soft.     Tenderness: There is no abdominal tenderness. There is no right CVA tenderness, left CVA tenderness or guarding.  Musculoskeletal:        General: No swelling, tenderness, deformity or signs of injury.     Right lower leg: No edema.     Left lower leg: No edema.  Skin:    General: Skin is warm and dry.     Capillary Refill: Capillary refill  takes 2 to 3 seconds.     Coloration: Skin is not jaundiced or pale.     Findings: No bruising, erythema or lesion.  Neurological:     Mental Status: She is alert and oriented to person, place, and time.     Motor: No weakness.     Coordination: Coordination normal.     Gait: Gait normal.  Psychiatric:        Mood and Affect: Mood normal.        Behavior: Behavior normal.        Thought Content: Thought content normal.        Judgment: Judgment normal.     BP 127/75   Pulse 76   Temp (!) 97 F (36.1 C)   Wt 255 lb (115.7 kg)   SpO2 100%   BMI 38.77 kg/m  Wt Readings from Last 3 Encounters:  05/04/23 255 lb (115.7 kg)  02/23/23 256 lb (116.1 kg)  01/26/23 259 lb 3.2 oz (117.6 kg)    Lab Results  Component Value Date   TSH 1.080 11/26/2021   Lab Results  Component Value Date   WBC 5.0 01/26/2023   HGB 13.6 01/26/2023   HCT 40.9 01/26/2023   MCV 92 01/26/2023   PLT 302 01/26/2023   Lab Results  Component Value Date   NA 141 01/26/2023   K 4.1 01/26/2023   CO2 23 01/26/2023   GLUCOSE 90 01/26/2023   BUN 12 01/26/2023   CREATININE 0.88 01/26/2023   BILITOT 0.3 01/26/2023   ALKPHOS 55 01/26/2023   AST 12 01/26/2023   ALT 10 01/26/2023   PROT 6.2 01/26/2023   ALBUMIN 3.9 01/26/2023   CALCIUM 9.1 01/26/2023   EGFR 80 01/26/2023   Lab Results  Component Value Date   CHOL 163 01/26/2023   Lab Results  Component Value Date   HDL 64 01/26/2023   Lab Results  Component Value Date  LDLCALC 87 01/26/2023   Lab Results  Component Value Date   TRIG 58 01/26/2023   Lab Results  Component Value Date   CHOLHDL 2.5 01/26/2023   Lab Results  Component Value Date   HGBA1C 5.1 06/29/2019      Assessment & Plan:   Problem List Items Addressed This Visit       Other   Class 3 obesity - Primary   Wt Readings from Last 3 Encounters:  05/04/23 255 lb (115.7 kg)  02/23/23 256 lb (116.1 kg)  01/26/23 259 lb 3.2 oz (117.6 kg)   Body mass index is 38.77  kg/m.  Patient has lost 1 pound since her last visit She would like to try Mounjaro, Mounjaro 0.25 mg once weekly injection ordered, phentermine discontinued Patient denies personal or family history of MTC or MEN 2.  They denied personal history of pancreatitis.  Patient encouraged to avoid fatty fried foods eat smaller portions of meal to help decrease nausea.  Encouraged to report abdominal pain, nausea, vomiting.  Patient counseled on low-carb diet, encouraged to engage in regular moderate to vigorous exercise at least 150 minutes weekly Follow-up in 4 [place]      Relevant Medications   tirzepatide [facility]) 2.5 MG/0.5ML Pen   Tobacco use disorder   Smokes about 7 cigarettes /day  Asked about quitting: confirms that he/she currently smokes cigarettes Advise to quit smoking: Educated about QUITTING to reduce the risk of cancer, cardio and cerebrovascular disease. Assess willingness: Unwilling to quit at this time, but is working on cutting back. Assist with counseling and pharmacotherapy: Counseled for 5 minutes and literature provided. Arrange for follow up: follow up in 1 month and continue to offer help.       Other Visit Diagnoses       Screening for colon cancer       Relevant Orders   Ambulatory referral to Gastroenterology       Meds ordered this encounter  Medications   tirzepatide [facility]) 2.5 MG/0.5ML Pen    Sig: Inject 2.5 mg into the skin once a week.    Dispense:  2 mL    Refill:  0    Follow-up: Return in about 4 [place] (around 06/01/2023) for obesity.    Donell Beers, FNP

## 2023-05-06 ENCOUNTER — Encounter: Payer: Self-pay | Admitting: Pediatrics

## 2023-05-08 ENCOUNTER — Other Ambulatory Visit: Payer: Self-pay

## 2023-05-25 ENCOUNTER — Ambulatory Visit: Payer: Self-pay | Admitting: Nurse Practitioner

## 2023-05-27 ENCOUNTER — Ambulatory Visit: Payer: 59

## 2023-05-27 VITALS — Ht 68.0 in | Wt 252.0 lb

## 2023-05-27 DIAGNOSIS — Z1211 Encounter for screening for malignant neoplasm of colon: Secondary | ICD-10-CM

## 2023-05-27 MED ORDER — NA SULFATE-K SULFATE-MG SULF 17.5-3.13-1.6 GM/177ML PO SOLN: 1.0000 | Freq: Once | ORAL | 0 refills | Status: AC

## 2023-05-27 NOTE — Progress Notes (Signed)
 No egg or soy allergy known to patient  No issues known to pt with past sedation with any surgeries or procedures Patient denies ever being told they had issues or difficulty with intubation  No FH of Malignant Hyperthermia Pt is on diet pills: phentermine  Pt is not on  home 02  Pt is not on blood thinners  Pt denies issues with constipation  No A fib or A flutter Have any cardiac testing pending-- no  LOA: independent  Prep: suprep   Patient's chart reviewed by Cathlyn Parsons CNRA prior to previsit and patient appropriate for the [facility].  Previsit completed and red dot placed by patient's name on their procedure day (on provider's schedule).     PV completed with patient. Prep instructions sent via mychart and home address.

## 2023-05-27 NOTE — Patient Instructions (Signed)
 [facility] has implemented a new process for scheduling procedures.  Please note your arrival time for the [facility] is your appointment time that is shown on your written instructions.  Please do not arrive one hour prior to the time listed in your instructions.  Please ignore any outside notifications to arrive one hour early.  We apologize for any confusion and look forward to seeing you for your procedure.

## 2023-06-01 ENCOUNTER — Other Ambulatory Visit: Payer: Self-pay

## 2023-06-03 ENCOUNTER — Ambulatory Visit: Payer: Self-pay | Admitting: Nurse Practitioner

## 2023-06-03 ENCOUNTER — Encounter: Payer: Self-pay | Admitting: Nurse Practitioner

## 2023-06-03 VITALS — BP 123/62 | HR 96 | Temp 97.8°F | Wt 253.8 lb

## 2023-06-03 DIAGNOSIS — Z23 Encounter for immunization: Secondary | ICD-10-CM

## 2023-06-03 DIAGNOSIS — E66813 Obesity, class 3: Secondary | ICD-10-CM | POA: Diagnosis not present

## 2023-06-03 NOTE — Assessment & Plan Note (Addendum)
 Patient educated on CDC recommendation for the PCV 20 vaccine. Verbal consent was obtained from the patient, vaccine administered by nurse, no sign of adverse reactions noted at this time. Patient education on arm soreness and use of tylenol or this patient  was discussed. Patient educated on the signs and symptoms of adverse effect and advise to contact the office if they occur.  The CMA had documented Pneumococcal vaccine 23 given but she actually gave PCV20 which is the correct vaccine , I have notified IT to delete the wrong entry .

## 2023-06-03 NOTE — Patient Instructions (Signed)
 1. Class 3 obesity (Primary)   2. Need for shingles vaccine  3. Need for pneumococcal 20-valent conjugate vaccination    It is important that you exercise regularly at least 30 minutes 5 times a week as tolerated  Think about what you will eat, plan ahead. Choose " clean, green, fresh or frozen" over canned, processed or packaged foods which are more sugary, salty and fatty. 70 to 75% of food eaten should be vegetables and fruit. Three meals at set times with snacks allowed between meals, but they must be fruit or vegetables. Aim to eat over a 12 hour period , example 7 am to 7 pm, and STOP after  your last meal of the day. Drink water,generally about 64 ounces per day, no other drink is as healthy. Fruit juice is best enjoyed in a healthy way, by EATING the fruit.  Thanks for choosing [facility] we consider it a privelige to serve you.

## 2023-06-03 NOTE — Assessment & Plan Note (Addendum)
 Wt Readings from Last 3 Encounters:  06/03/23 253 lb 12.8 oz (115.1 kg)  05/27/23 252 lb (114.3 kg)  05/04/23 255 lb (115.7 kg)   Body mass index is 38.59 kg/m.  Patient counseled on low-carb diet Encouraged to engage in regular moderate to vigorous exercises at least 150 minutes weekly Continue phentermine 37.5 mg daily Follow-up in 3 months

## 2023-06-03 NOTE — Progress Notes (Unsigned)
 Established Patient Office Visit  Subjective:  Patient ID: Lorraine [name], female    DOB: 11-Apr-1970  Age: 53 y.o. MRN: [id number]  CC:  Chief Complaint  Patient presents with   Medical Management of Chronic Issues    Insurance didn't cover [place]     HPI Lorraine [name] is a 53 y.o. female  has a past medical history of Allergy, Class 3 obesity (04/02/2011), Hidradenitis suppurativa (04/02/2011), Seasonal allergies, SVD (spontaneous vaginal delivery), and Tobacco use disorder (12/29/2022).  Patient presents for follow-up for obesity.   Obesity.  Patient stated that she has been going to the gym daily, still working on her diet.  Takes phentermine 37.5 mg daily.  Insurance did not approve [place]  Due for shingles vaccine and pneumococcal vaccine, both vaccines given in the office today       Past Medical History:  Diagnosis Date   Allergy    Seasonal   Class 3 obesity 04/02/2011   Hidradenitis suppurativa 04/02/2011   Seasonal allergies    SVD (spontaneous vaginal delivery)    x 1   Tobacco use disorder 12/29/2022    Past Surgical History:  Procedure Laterality Date   BREAST DUCTAL SYSTEM EXCISION Bilateral 10/31/2019   Procedure: BILATERAL BREAST DUCTAL EXCISION;  Surgeon: Abigail Miyamoto, MD;  Location: [facility];  Service: General;  Laterality: Bilateral;  LMA   CHOLECYSTECTOMY     MASS EXCISION Right 08/19/2018   Procedure: EXCISION CHRONIC RIGHT BREAST ABSCESS;  Surgeon: Abigail Miyamoto, MD;  Location: [facility];  Service: General;  Laterality: Right;   PILONIDAL CYST EXCISION     WISDOM TOOTH EXTRACTION      Family History  Problem Relation Age of Onset   Thyroid disease Mother        Pituitary tumor   Kidney disease Mother        renal tumor- benign   Heart disease Father    Diabetes Father    Thyroid disease Sister    Diabetes Paternal Aunt    Diabetes Paternal Uncle    Diabetes Maternal Grandmother    Colon cancer Neg Hx     Rectal cancer Neg Hx    Stomach cancer Neg Hx     Social History   Socioeconomic History   Marital status: Significant Other    Spouse name: Not on file   Number of children: 1   Years of education: Not on file   Highest education level: Not on file  Occupational History   Not on file  Tobacco Use   Smoking status: Every Day    Current packs/day: 0.50    Average packs/day: 0.5 packs/day for 24.0 years (12.0 ttl pk-yrs)    Types: Cigarettes   Smokeless tobacco: Never  Vaping Use   Vaping status: Never Used  Substance and Sexual Activity   Alcohol use: Yes    Alcohol/week: 1.0 standard drink of alcohol    Types: 1 Glasses of wine per week    Comment: SOCIALLY (TWICE A MONTH)   Drug use: No   Sexual activity: Yes    Birth control/protection: Condom  Other Topics Concern   Not on file  Social History Narrative   Lives with her boyfriend and daughter    Social Drivers of Health   Financial Resource Strain: Not on file  Food Insecurity: No Food Insecurity (06/03/2023)   Hunger Vital Sign    Worried About Running Out of Food in the Last Year: Never true  Ran Out of Food in the Last Year: Never true  Transportation Needs: No Transportation Needs (06/03/2023)   PRAPARE - Administrator, Civil Service (Medical): No    Lack of Transportation (Non-Medical): No  Physical Activity: Insufficiently Active (10/01/2017)   Exercise Vital Sign    Days of Exercise per Week: 2 days    Minutes of Exercise per Session: 30 min  Stress: Not on file  Social Connections: Unknown (07/23/2021)   [facility], [facility]   Social Network    Social Network: Not on file  Intimate Partner Violence: Unknown (06/14/2021)   Received from [facility], [facility]   HITS    Physically Hurt: Not on file    Insult or Talk Down To: Not on file    Threaten Physical Harm: Not on file    Scream or Curse: Not on file    Outpatient Medications Prior to Visit   Medication Sig Dispense Refill   phentermine 37.5 MG capsule Take 37.5 mg by mouth daily.     cyclobenzaprine (FLEXERIL) 10 MG tablet Take 1 tablet (10 mg total) by mouth 3 (three) times daily as needed for muscle spasms. (Patient not taking: Reported on 12/29/2022) 10 tablet 0   fluticasone (FLONASE) 50 MCG/ACT nasal spray Place 2 sprays into both nostrils daily. (Patient not taking: Reported on 12/29/2022) 16 g 0   levocetirizine (XYZAL) 5 MG tablet Take 1 tablet (5 mg total) by mouth every evening. (Patient not taking: Reported on 12/29/2022) 30 tablet 01   rizatriptan (MAXALT) 10 MG tablet Take 1 tablet (10 mg total) by mouth as needed for migraine. May repeat in 2 hours if needed, not to take more than two times in a day. (Patient not taking: Reported on 01/26/2023) 10 tablet 0   tirzepatide (MOUNJARO) 2.5 MG/0.5ML Pen Inject 2.5 mg into the skin once a week. (Patient not taking: Reported on 06/03/2023) 2 mL 0   No facility-administered medications prior to visit.    Allergies  Allergen Reactions   Penicillins Other (See Comments)    Did it involve swelling of the face/tongue/throat, SOB, or low BP? Unknown Did it involve sudden or severe rash/hives, skin peeling, or any reaction on the inside of your mouth or nose? Unknown Did you need to seek medical attention at a hospital or doctor's office? Unknown When did it last happen?childhood reaction If all above answers are "NO", may proceed with cephalosporin use.     ROS Review of Systems  Constitutional:  Negative for appetite change, chills, fatigue and fever.  HENT:  Negative for congestion, postnasal drip, rhinorrhea and sneezing.   Respiratory:  Negative for cough, shortness of breath and wheezing.   Cardiovascular:  Negative for chest pain, palpitations and leg swelling.  Gastrointestinal:  Negative for abdominal pain, constipation, nausea and vomiting.  Genitourinary:  Negative for difficulty urinating, dysuria, flank pain and  frequency.  Musculoskeletal:  Negative for arthralgias, back pain, joint swelling and myalgias.  Skin:  Negative for color change, pallor, rash and wound.  Neurological:  Negative for dizziness, facial asymmetry, weakness, numbness and headaches.  Psychiatric/Behavioral:  Negative for behavioral problems, confusion, self-injury and suicidal ideas.       Objective:    Physical Exam Vitals and nursing note reviewed.  Constitutional:      General: She is not in acute distress.    Appearance: Normal appearance. She is obese. She is not ill-appearing, toxic-appearing or diaphoretic.  HENT:  Mouth/Throat:     Mouth: Mucous membranes are moist.     Pharynx: Oropharynx is clear. No oropharyngeal exudate or posterior oropharyngeal erythema.  Eyes:     General: No scleral icterus.       Right eye: No discharge.        Left eye: No discharge.     Extraocular Movements: Extraocular movements intact.     Conjunctiva/sclera: Conjunctivae normal.  Cardiovascular:     Rate and Rhythm: Normal rate and regular rhythm.     Pulses: Normal pulses.     Heart sounds: Normal heart sounds. No murmur heard.    No friction rub. No gallop.  Pulmonary:     Effort: Pulmonary effort is normal. No respiratory distress.     Breath sounds: Normal breath sounds. No stridor. No wheezing, rhonchi or rales.  Chest:     Chest wall: No tenderness.  Abdominal:     General: There is no distension.     Palpations: Abdomen is soft.     Tenderness: There is no abdominal tenderness. There is no right CVA tenderness, left CVA tenderness or guarding.  Musculoskeletal:        General: No swelling, tenderness, deformity or signs of injury.     Right lower leg: No edema.     Left lower leg: No edema.  Skin:    General: Skin is warm and dry.     Capillary Refill: Capillary refill takes less than 2 seconds.     Coloration: Skin is not jaundiced or pale.     Findings: No bruising, erythema or lesion.  Neurological:      Mental Status: She is alert and oriented to person, place, and time.     Motor: No weakness.     Coordination: Coordination normal.     Gait: Gait normal.  Psychiatric:        Mood and Affect: Mood normal.        Behavior: Behavior normal.        Thought Content: Thought content normal.        Judgment: Judgment normal.     BP 123/62   Pulse 96   Temp 97.8 F (36.6 C) (Oral)   Wt 253 lb 12.8 oz (115.1 kg)   LMP 05/06/2023   SpO2 100%   BMI 38.59 kg/m  Wt Readings from Last 3 Encounters:  06/03/23 253 lb 12.8 oz (115.1 kg)  05/27/23 252 lb (114.3 kg)  05/04/23 255 lb (115.7 kg)    Lab Results  Component Value Date   TSH 1.080 11/26/2021   Lab Results  Component Value Date   WBC 5.0 01/26/2023   HGB 13.6 01/26/2023   HCT 40.9 01/26/2023   MCV 92 01/26/2023   PLT 302 01/26/2023   Lab Results  Component Value Date   NA 141 01/26/2023   K 4.1 01/26/2023   CO2 23 01/26/2023   GLUCOSE 90 01/26/2023   BUN 12 01/26/2023   CREATININE 0.88 01/26/2023   BILITOT 0.3 01/26/2023   ALKPHOS 55 01/26/2023   AST 12 01/26/2023   ALT 10 01/26/2023   PROT 6.2 01/26/2023   ALBUMIN 3.9 01/26/2023   CALCIUM 9.1 01/26/2023   EGFR 80 01/26/2023   Lab Results  Component Value Date   CHOL 163 01/26/2023   Lab Results  Component Value Date   HDL 64 01/26/2023   Lab Results  Component Value Date   LDLCALC 87 01/26/2023   Lab Results  Component Value Date  TRIG 58 01/26/2023   Lab Results  Component Value Date   CHOLHDL 2.5 01/26/2023   Lab Results  Component Value Date   HGBA1C 5.1 06/29/2019      Assessment & Plan:   Problem List Items Addressed This Visit       Other   Class 3 obesity - Primary   Wt Readings from Last 3 Encounters:  06/03/23 253 lb 12.8 oz (115.1 kg)  05/27/23 252 lb (114.3 kg)  05/04/23 255 lb (115.7 kg)   Body mass index is 38.59 kg/m.  Patient counseled on low-carb diet Encouraged to engage in regular moderate to vigorous  exercises at least 150 minutes weekly Continue phentermine 37.5 mg daily Follow-up in 3 months      Need for shingles vaccine   Patient educated on CDC recommendation for the vaccine. Verbal consent was obtained from the patient, vaccine administered by nurse, no sign of adverse reactions noted at this time. Patient education on arm soreness and use of tylenol or this patient  was discussed. Patient educated on the signs and symptoms of adverse effect and advise to contact the office if they occur.       Relevant Orders   Varicella-zoster vaccine IM (Completed)   Need for pneumococcal 20-valent conjugate vaccination   Patient educated on CDC recommendation for the PCV 20 vaccine. Verbal consent was obtained from the patient, vaccine administered by nurse, no sign of adverse reactions noted at this time. Patient education on arm soreness and use of tylenol or this patient  was discussed. Patient educated on the signs and symptoms of adverse effect and advise to contact the office if they occur.  The CMA had documented Pneumococcal vaccine 23 given but she actually gave PCV20 which is the correct vaccine , I have notified IT to delete the wrong entry .        Relevant Orders   Pneumococcal polysaccharide vaccine 23-valent greater than or equal to 2yo subcutaneous/IM (Completed)    No orders of the defined types were placed in this encounter.   Follow-up: Return in about 3 months (around 09/03/2023), or obesity.    Donell Beers, FNP

## 2023-06-03 NOTE — Assessment & Plan Note (Signed)
Patient educated on CDC recommendation for the vaccine. Verbal consent was obtained from the patient, vaccine administered by nurse, no sign of adverse reactions noted at this time. Patient education on arm soreness and use of tylenol or this patient  was discussed. Patient educated on the signs and symptoms of adverse effect and advise to contact the office if they occur.  ?

## 2023-06-04 DIAGNOSIS — Z23 Encounter for immunization: Secondary | ICD-10-CM | POA: Diagnosis not present

## 2023-06-18 ENCOUNTER — Telehealth: Payer: Self-pay | Admitting: Pediatrics

## 2023-06-18 ENCOUNTER — Encounter: Payer: Self-pay | Admitting: Pediatrics

## 2023-06-18 NOTE — Telephone Encounter (Signed)
 Returned pts call.  Advised her that this should not interfere with her having her procedure tomorrow and that she is ok to proceed.

## 2023-06-18 NOTE — Progress Notes (Deleted)
 [facility] History and Physical   Primary Care Physician:  Donell Beers, FNP   Reason for Procedure:  Colorectal cancer screening  Plan:    Screening colonoscopy  HPI: Lorraine [name] is a 53 y.o. female undergoing screening colonoscopy for colorectal cancer screening.  No prior colonoscopy.  No family history of colorectal cancer or polyps.  Patient denies current symptoms of change in bowel habits or rectal bleeding.   Past Medical History:  Diagnosis Date   Allergy    Seasonal   Class 3 obesity 04/02/2011   Hidradenitis suppurativa 04/02/2011   Seasonal allergies    SVD (spontaneous vaginal delivery)    x 1   Tobacco use disorder 12/29/2022    Past Surgical History:  Procedure Laterality Date   BREAST DUCTAL SYSTEM EXCISION Bilateral 10/31/2019   Procedure: BILATERAL BREAST DUCTAL EXCISION;  Surgeon: Abigail Miyamoto, MD;  Location: [facility];  Service: General;  Laterality: Bilateral;  LMA   CHOLECYSTECTOMY     MASS EXCISION Right 08/19/2018   Procedure: EXCISION CHRONIC RIGHT BREAST ABSCESS;  Surgeon: Abigail Miyamoto, MD;  Location: [facility];  Service: General;  Laterality: Right;   PILONIDAL CYST EXCISION     WISDOM TOOTH EXTRACTION      Prior to Admission medications   Medication Sig Start Date End Date Taking? Authorizing Provider  cyclobenzaprine (FLEXERIL) 10 MG tablet Take 1 tablet (10 mg total) by mouth 3 (three) times daily as needed for muscle spasms. Patient not taking: Reported on 12/29/2022 03/25/22   Roxy Horseman, PA-C  fluticasone [facility]) 50 MCG/ACT nasal spray Place 2 sprays into both nostrils daily. Patient not taking: Reported on 12/29/2022 12/01/22   Freddy Finner, NP  levocetirizine (XYZAL) 5 MG tablet Take 1 tablet (5 mg total) by mouth every evening. Patient not taking: Reported on 12/29/2022 12/01/22   Freddy Finner, NP  phentermine 37.5 MG capsule Take 37.5 mg by mouth daily.    [provider]  rizatriptan (MAXALT) 10 MG tablet Take 1 tablet (10 mg total) by mouth as needed for migraine. May repeat in 2 hours if needed, not to take more than two times in a day. Patient not taking: Reported on 01/26/2023 12/01/22   Freddy Finner, NP    Current Outpatient Medications  Medication Sig Dispense Refill   cyclobenzaprine (FLEXERIL) 10 MG tablet Take 1 tablet (10 mg total) by mouth 3 (three) times daily as needed for muscle spasms. (Patient not taking: Reported on 12/29/2022) 10 tablet 0   fluticasone (FLONASE) 50 MCG/ACT nasal spray Place 2 sprays into both nostrils daily. (Patient not taking: Reported on 12/29/2022) 16 g 0   levocetirizine (XYZAL) 5 MG tablet Take 1 tablet (5 mg total) by mouth every evening. (Patient not taking: Reported on 12/29/2022) 30 tablet 01   phentermine 37.5 MG capsule Take 37.5 mg by mouth daily.     rizatriptan (MAXALT) 10 MG tablet Take 1 tablet (10 mg total) by mouth as needed for migraine. May repeat in 2 hours if needed, not to take more than two times in a day. (Patient not taking: Reported on 01/26/2023) 10 tablet 0   No current facility-administered medications for this visit.    Allergies as of 06/19/2023 - Review Complete 06/03/2023  Allergen Reaction Noted   Penicillins Other (See Comments) 07/08/2010    Family History  Problem Relation Age of Onset   Thyroid disease Mother        Pituitary tumor  Kidney disease Mother        renal tumor- benign   Heart disease Father    Diabetes Father    Thyroid disease Sister    Diabetes Paternal Aunt    Diabetes Paternal Uncle    Diabetes Maternal Grandmother    Colon cancer Neg Hx    Rectal cancer Neg Hx    Stomach cancer Neg Hx     Social History   Socioeconomic History   Marital status: Significant Other    Spouse name: Not on file   Number of children: 1   Years of education: Not on file   Highest education level: Not on file  Occupational History   Not on file  Tobacco Use    Smoking status: Every Day    Current packs/day: 0.50    Average packs/day: 0.5 packs/day for 24.0 years (12.0 ttl pk-yrs)    Types: Cigarettes   Smokeless tobacco: Never  Vaping Use   Vaping status: Never Used  Substance and Sexual Activity   Alcohol use: Yes    Alcohol/week: 1.0 standard drink of alcohol    Types: 1 Glasses of wine per week    Comment: SOCIALLY (TWICE A MONTH)   Drug use: No   Sexual activity: Yes    Birth control/protection: Condom  Other Topics Concern   Not on file  Social History Narrative   Lives with her boyfriend and daughter    Social Drivers of Health   Financial Resource Strain: Not on file  Food Insecurity: No Food Insecurity (06/03/2023)   Hunger Vital Sign    Worried About Running Out of Food in the Last Year: Never true    Ran Out of Food in the Last Year: Never true  Transportation Needs: No Transportation Needs (06/03/2023)   PRAPARE - Administrator, Civil Service (Medical): No    Lack of Transportation (Non-Medical): No  Physical Activity: Insufficiently Active (10/01/2017)   Exercise Vital Sign    Days of Exercise per Week: 2 days    Minutes of Exercise per Session: 30 min  Stress: Not on file  Social Connections: Unknown (07/23/2021)   Received from [facility], [facility]   Social Network    Social Network: Not on file  Intimate Partner Violence: Unknown (06/14/2021)   Received from [facility], [facility]   HITS    Physically Hurt: Not on file    Insult or Talk Down To: Not on file    Threaten Physical Harm: Not on file    Scream or Curse: Not on file    Review of Systems:  All other review of systems negative except as mentioned in the HPI.  Physical Exam: Vital signs LMP 05/06/2023   General:   Alert,  Well-developed, well-nourished, pleasant and cooperative in NAD Airway:  Mallampati  Lungs:  Clear throughout to auscultation.   Heart:  Regular rate and rhythm; no murmurs, clicks, rubs,  or  gallops. Abdomen:  Soft, nontender and nondistended. Normal bowel sounds.   Neuro/Psych:  Normal mood and affect. A and O x 3  [name], MD [facility]

## 2023-06-18 NOTE — Telephone Encounter (Signed)
 Inbound call from patient stating she has ate salads on Monday and Tuesday. Patient is scheduled for colonoscopy tomorrow 4/11 and requesting to know if it is okay to proceed. Please advise, thank you.

## 2023-06-19 ENCOUNTER — Encounter: Payer: 59 | Admitting: Pediatrics

## 2023-06-19 NOTE — Telephone Encounter (Signed)
 Good Morning Dr Doy Hutching   Patient called to cancel today's procedure due to her not having a care partner. States she will give a call back to reschedule.

## 2023-07-14 ENCOUNTER — Other Ambulatory Visit: Payer: Self-pay | Admitting: Nurse Practitioner

## 2023-07-14 DIAGNOSIS — E66813 Obesity, class 3: Secondary | ICD-10-CM

## 2023-07-14 MED ORDER — PHENTERMINE HCL 37.5 MG PO CAPS
37.5000 mg | ORAL_CAPSULE | Freq: Every day | ORAL | 0 refills | Status: DC
Start: 2023-07-14 — End: 2023-09-18

## 2023-08-05 ENCOUNTER — Ambulatory Visit (INDEPENDENT_AMBULATORY_CARE_PROVIDER_SITE_OTHER): Admitting: Dermatology

## 2023-08-05 DIAGNOSIS — Z91199 Patient's noncompliance with other medical treatment and regimen due to unspecified reason: Secondary | ICD-10-CM

## 2023-08-05 NOTE — Progress Notes (Signed)
 Patient no-showed today's appointment; appointment was for New Patient visit for HS,

## 2023-09-08 ENCOUNTER — Encounter: Payer: Self-pay | Admitting: Nurse Practitioner

## 2023-09-08 ENCOUNTER — Ambulatory Visit: Payer: Self-pay | Admitting: Nurse Practitioner

## 2023-09-08 VITALS — BP 104/64 | HR 96 | Wt 248.0 lb

## 2023-09-08 DIAGNOSIS — F17200 Nicotine dependence, unspecified, uncomplicated: Secondary | ICD-10-CM | POA: Diagnosis not present

## 2023-09-08 DIAGNOSIS — E66813 Obesity, class 3: Secondary | ICD-10-CM

## 2023-09-08 NOTE — Assessment & Plan Note (Addendum)
 Wt Readings from Last 3 Encounters:  09/08/23 248 lb (112.5 kg)  06/03/23 253 lb 12.8 oz (115.1 kg)  05/27/23 252 lb (114.3 kg)   Body mass index is 37.71 kg/m.  Patient has lost 5 pounds since her last visit, she has been taking phentermine  37.5 mg off and on She has been trying to eat healthy cutting down on high carbohydrate foods, engaging in regular moderate exercises as tolerated She denies any adverse reactions to phentermine  Continue phentermine  37.5 mg daily Counseled on low-carb diet, encouraged regular moderate to vigorous exercises at least Lorraine [name] 50 minutes weekly as tolerated Will do phentermine  for another 3 months

## 2023-09-08 NOTE — Assessment & Plan Note (Addendum)
 Smokes 6-7 daily, not interested in quitting at this time Cessation encouraged

## 2023-09-08 NOTE — Patient Instructions (Signed)

## 2023-09-08 NOTE — Progress Notes (Signed)
 Established Patient Office Visit  Subjective:  Patient ID: Lorraine [name], female    DOB: 24-Oct-1970  Age: 53 y.o. MRN: [id number]  CC:  Chief Complaint  Patient presents with   Obesity    Follow     HPI Lorraine [name] is a 53 y.o. female  has a past medical history of Allergy, Class 3 obesity (04/02/2011), Hidradenitis suppurativa (04/02/2011), Seasonal allergies, SVD (spontaneous vaginal delivery), and Tobacco use disorder (12/29/2022).  Patient presents for follow-up for obesity  Please see assessment and plan section for full HPI and plan Missed her appointment for colonoscopy she plans to reschedule appointment, plans to get her cervical cancer screening done at next visit      Past Medical History:  Diagnosis Date   Allergy    Seasonal   Class 3 obesity 04/02/2011   Hidradenitis suppurativa 04/02/2011   Seasonal allergies    SVD (spontaneous vaginal delivery)    x 1   Tobacco use disorder 12/29/2022    Past Surgical History:  Procedure Laterality Date   BREAST DUCTAL SYSTEM EXCISION Bilateral 10/31/2019   Procedure: BILATERAL BREAST DUCTAL EXCISION;  Surgeon: Vernetta Berg, MD;  Location: [facility];  Service: General;  Laterality: Bilateral;  LMA   CHOLECYSTECTOMY     MASS EXCISION Right 08/19/2018   Procedure: EXCISION CHRONIC RIGHT BREAST ABSCESS;  Surgeon: Vernetta Berg, MD;  Location: [facility];  Service: General;  Laterality: Right;   PILONIDAL CYST EXCISION     WISDOM TOOTH EXTRACTION      Family History  Problem Relation Age of Onset   Thyroid disease Mother        Pituitary tumor   Kidney disease Mother        renal tumor- benign   Heart disease Father    Diabetes Father    Thyroid disease Sister    Diabetes Paternal Aunt    Diabetes Paternal Uncle    Diabetes Maternal Grandmother    Colon cancer Neg Hx    Rectal cancer Neg Hx    Stomach cancer Neg Hx     Social History   Socioeconomic History   Marital  status: Significant Other    Spouse name: Not on file   Number of children: 1   Years of education: Not on file   Highest education level: Not on file  Occupational History   Not on file  Tobacco Use   Smoking status: Every Day    Current packs/day: 0.50    Average packs/day: 0.5 packs/day for 24.0 years (12.0 ttl pk-yrs)    Types: Cigarettes   Smokeless tobacco: Never  Vaping Use   Vaping status: Never Used  Substance and Sexual Activity   Alcohol use: Yes    Alcohol/week: 1.0 standard drink of alcohol    Types: 1 Glasses of wine per week    Comment: SOCIALLY (TWICE A MONTH)   Drug use: No   Sexual activity: Yes    Birth control/protection: Condom  Other Topics Concern   Not on file  Social History Narrative   Lives with her boyfriend and daughter    Social Drivers of Health   Financial Resource Strain: Not on file  Food Insecurity: No Food Insecurity (06/03/2023)   Hunger Vital Sign    Worried About Running Out of Food in the Last Year: Never true    Ran Out of Food in the Last Year: Never true  Transportation Needs: No Transportation Needs (06/03/2023)   PRAPARE -  Administrator, Civil Service (Medical): No    Lack of Transportation (Non-Medical): No  Physical Activity: Insufficiently Active (10/01/2017)   Exercise Vital Sign    Days of Exercise per Week: 2 days    Minutes of Exercise per Session: 30 min  Stress: Not on file  Social Connections: Unknown (07/23/2021)   Received [facility]   Social Network    Social Network: Not on file  Intimate Partner Violence: Unknown (06/14/2021)   Received from [facility]   HITS    Physically Hurt: Not on file    Insult or Talk Down To: Not on file    Threaten Physical Harm: Not on file    Scream or Curse: Not on file    Outpatient Medications Prior to Visit  Medication Sig Dispense Refill   phentermine  37.5 MG capsule Take 1 capsule (37.5 mg total) by mouth daily. 30 capsule 0   cyclobenzaprine   (FLEXERIL ) 10 MG tablet Take 1 tablet (10 mg total) by mouth 3 (three) times daily as needed for muscle spasms. (Patient not taking: Reported on 09/08/2023) 10 tablet 0   fluticasone  (FLONASE ) 50 MCG/ACT nasal spray Place 2 sprays into both nostrils daily. (Patient not taking: Reported on 09/08/2023) 16 g 0   levocetirizine (XYZAL ) 5 MG tablet Take 1 tablet (5 mg total) by mouth every evening. (Patient not taking: Reported on 09/08/2023) 30 tablet 01   rizatriptan  (MAXALT ) 10 MG tablet Take 1 tablet (10 mg total) by mouth as needed for migraine. May repeat in 2 hours if needed, not to take more than two times in a day. (Patient not taking: Reported on 09/08/2023) 10 tablet 0   No facility-administered medications prior to visit.    Allergies  Allergen Reactions   Penicillins Other (See Comments)    Did it involve swelling of the face/tongue/throat, SOB, or low BP? Unknown Did it involve sudden or severe rash/hives, skin peeling, or any reaction on the inside of your mouth or nose? Unknown Did you need to seek medical attention at a hospital or doctor's office? Unknown When did it last happen?childhood reaction If all above answers are "NO", may proceed with cephalosporin use.     ROS Review of Systems  Constitutional:  Negative for appetite change, chills, fatigue and fever.  HENT:  Negative for congestion, postnasal drip, rhinorrhea and sneezing.   Respiratory:  Negative for cough, shortness of breath and wheezing.   Cardiovascular:  Negative for chest pain, palpitations and leg swelling.  Gastrointestinal:  Negative for abdominal pain, constipation, nausea and vomiting.  Genitourinary:  Negative for difficulty urinating, dysuria, flank pain and frequency.  Musculoskeletal:  Negative for arthralgias, back pain, joint swelling and myalgias.  Skin:  Negative for color change, pallor, rash and wound.  Neurological:  Negative for dizziness, facial asymmetry, weakness, numbness and headaches.   Psychiatric/Behavioral:  Negative for behavioral problems, confusion, self-injury and suicidal ideas.       Objective:    Physical Exam Vitals and nursing note reviewed.  Constitutional:      General: She is not in acute distress.    Appearance: Normal appearance. She is obese. She is not ill-appearing, toxic-appearing or diaphoretic.   Eyes:     General: No scleral icterus.       Right eye: No discharge.        Left eye: No discharge.     Extraocular Movements: Extraocular movements intact.     Conjunctiva/sclera: Conjunctivae normal.    Cardiovascular:  Rate and Rhythm: Normal rate and regular rhythm.     Pulses: Normal pulses.     Heart sounds: Normal heart sounds. No murmur heard.    No friction rub. No gallop.  Pulmonary:     Effort: Pulmonary effort is normal. No respiratory distress.     Breath sounds: Normal breath sounds. No stridor. No wheezing, rhonchi or rales.  Chest:     Chest wall: No tenderness.  Abdominal:     General: There is no distension.     Palpations: Abdomen is soft.     Tenderness: There is no abdominal tenderness. There is no right CVA tenderness, left CVA tenderness or guarding.   Musculoskeletal:        General: No swelling, tenderness, deformity or signs of injury.     Right lower leg: No edema.     Left lower leg: No edema.   Skin:    General: Skin is warm and dry.     Capillary Refill: Capillary refill takes less than 2 seconds.     Coloration: Skin is not jaundiced or pale.     Findings: No bruising, erythema or lesion.   Neurological:     Mental Status: She is alert and oriented to person, place, and time.     Motor: No weakness.     Gait: Gait normal.   Psychiatric:        Mood and Affect: Mood normal.        Behavior: Behavior normal.        Thought Content: Thought content normal.        Judgment: Judgment normal.     BP 104/64   Pulse 96   Wt 248 lb (112.5 kg)   SpO2 100%   BMI 37.71 kg/m  Wt Readings from  Last 3 Encounters:  09/08/23 248 lb (112.5 kg)  06/03/23 253 lb 12.8 oz (115.1 kg)  05/27/23 252 lb (114.3 kg)    Lab Results  Component Value Date   TSH 1.080 11/26/2021   Lab Results  Component Value Date   WBC 5.0 01/26/2023   HGB 13.6 01/26/2023   HCT 40.9 01/26/2023   MCV 92 01/26/2023   PLT 302 01/26/2023   Lab Results  Component Value Date   NA 141 01/26/2023   K 4.1 01/26/2023   CO2 23 01/26/2023   GLUCOSE 90 01/26/2023   BUN 12 01/26/2023   CREATININE 0.88 01/26/2023   BILITOT 0.3 01/26/2023   ALKPHOS 55 01/26/2023   AST 12 01/26/2023   ALT 10 01/26/2023   PROT 6.2 01/26/2023   ALBUMIN 3.9 01/26/2023   CALCIUM  9.1 01/26/2023   EGFR 80 01/26/2023   Lab Results  Component Value Date   CHOL 163 01/26/2023   Lab Results  Component Value Date   HDL 64 01/26/2023   Lab Results  Component Value Date   LDLCALC 87 01/26/2023   Lab Results  Component Value Date   TRIG 58 01/26/2023   Lab Results  Component Value Date   CHOLHDL 2.5 01/26/2023   Lab Results  Component Value Date   HGBA1C 5.1 06/29/2019      Assessment & Plan:   Problem List Items Addressed This Visit       Other   Class 3 obesity - Primary   Wt Readings from Last 3 Encounters:  09/08/23 248 lb (112.5 kg)  06/03/23 253 lb 12.8 oz (115.1 kg)  05/27/23 252 lb (114.3 kg)   Body mass index is 37.71  kg/m.  Patient has lost 5 pounds since her last visit, she has been taking phentermine  37.5 mg off and on She has been trying to eat healthy cutting down on high carbohydrate foods, engaging in regular moderate exercises as tolerated She denies any adverse reactions to phentermine  Continue phentermine  37.5 mg daily Counseled on low-carb diet, encouraged regular moderate to vigorous exercises at least [place] 50 minutes weekly as tolerated Will do phentermine  for another 3 months      Tobacco use disorder   Smokes 6-7 daily, not interested in quitting at this time Cessation  encouraged       No orders of the defined types were placed in this encounter.   Follow-up: Return in about 3 months (around 12/09/2023), or obesity.    Dashana Guizar R Derell Bruun, FNP

## 2023-09-17 ENCOUNTER — Other Ambulatory Visit: Payer: Self-pay | Admitting: Nurse Practitioner

## 2023-09-17 DIAGNOSIS — E66813 Obesity, class 3: Secondary | ICD-10-CM

## 2023-12-09 ENCOUNTER — Ambulatory Visit: Payer: Self-pay | Admitting: Nurse Practitioner

## 2023-12-09 ENCOUNTER — Encounter: Payer: Self-pay | Admitting: Nurse Practitioner

## 2023-12-09 VITALS — BP 127/66 | HR 79 | Wt 254.0 lb

## 2023-12-09 DIAGNOSIS — Z1231 Encounter for screening mammogram for malignant neoplasm of breast: Secondary | ICD-10-CM | POA: Diagnosis not present

## 2023-12-09 DIAGNOSIS — L732 Hidradenitis suppurativa: Secondary | ICD-10-CM

## 2023-12-09 DIAGNOSIS — Z23 Encounter for immunization: Secondary | ICD-10-CM

## 2023-12-09 DIAGNOSIS — E66813 Obesity, class 3: Secondary | ICD-10-CM

## 2023-12-09 NOTE — Assessment & Plan Note (Addendum)
 Wt Readings from Last 3 Encounters:  12/09/23 254 lb (115.2 kg)  09/08/23 248 lb (112.5 kg)  06/03/23 253 lb 12.8 oz (115.1 kg)   Body mass index is 38.62 kg/m.  She has added 6 pounds since her last visit Class 3 obesity with recent weight gain. Phentermine  used intermittently without significant weight loss. Discussed diet, exercise, and potential transition to [facility] for long-term management. Emphasized lifestyle changes for sustained weight loss. - Phentermine  discontinued due to her  lack of weight loss benefit from the medication - Refer to a dietitian for nutritional counseling. - Encourage reduction of carbohydrate intake, especially breads and sodas. - Advise increasing vegetable and protein intake. - Encourage regular exercise at least 30 minutes 5 days a week  - Discuss potential transition to Contrave after making lifestyle changes  Follow-up in 4 months

## 2023-12-09 NOTE — Assessment & Plan Note (Signed)
  Hidradenitis suppurativa with no  flare-ups currently. Advised dermatologist evaluation for appropriate management. - Refer to a dermatologist for evaluation and management of hidradenitis suppurativa.

## 2023-12-09 NOTE — Patient Instructions (Addendum)
 [facility] Address: [address], [place], [place] [address] Phone: [phone]   Please call the medical weight management clinic in 2 to 3 [place] to follow-up on your patient [phone]  Please come fasting to the next appointment for physical   Please call [phone]   to schedule your mammogram.  [facility]. [address] [place], [place] [address]. [place] .    It is important that you exercise regularly at least 30 minutes 5 times a week as tolerated  Think about what you will eat, plan ahead. Choose  clean, green, fresh or frozen over canned, processed or packaged foods which are more sugary, salty and fatty. 70 to 75% of food eaten should be vegetables and fruit. Three meals at set times with snacks allowed between meals, but they must be fruit or vegetables. Aim to eat over a 12 hour period , example 7 am to 7 pm, and STOP after  your last meal of the day. Drink water,generally about 64 ounces per day, no other drink is as healthy. Fruit juice is best enjoyed in a healthy way, by EATING the fruit.  Thanks for choosing [facility] we consider it a privelige to serve you.

## 2023-12-09 NOTE — Progress Notes (Signed)
 Established Patient Office Visit  Subjective:  Patient ID: Lorraine [name], female    DOB: [birth year]  Age: 53 y.o. MRN: [id number]  CC:  Chief Complaint  Patient presents with   Obesity    HPI   Discussed the use of AI scribe software for clinical note transcription with the patient, who gave verbal consent to proceed.  History of Present Illness Lorraine [name] is a 53 year old female  has a past medical history of Allergy, Class 3 obesity (HCC) (04/02/2011), Hidradenitis suppurativa (04/02/2011), Seasonal allergies, SVD (spontaneous vaginal delivery), and Tobacco use disorder (12/29/2022).  who presents for a weight follow-up.  She is experiencing difficulty managing her weight, attributing it to a lack of exercise due to being overwhelmed with work. She has not taken phentermine  for over a month, with the last use being a couple of times in August. Her diet includes many vegetables and salads, but she acknowledges consuming breads and sodas, which she plans to reduce. She wants to get back on track with her weight management.  She inquires about her hidradenitis suppurativa, noting frequent flare-ups around her bra area. She mentions seeing a commercial for a potential treatment and asks if it requires a specialist's prescription.  She has not had a colon cancer screening due to missing the directions for her scheduled appointment and needs to reschedule. She is unsure about her flu vaccine status, thinking she might have received it previously.  Assessment and Plan Assessment & Plan     Past Medical History:  Diagnosis Date   Allergy    Seasonal   Class 3 obesity (HCC) 04/02/2011   Hidradenitis suppurativa 04/02/2011   Seasonal allergies    SVD (spontaneous vaginal delivery)    x 1   Tobacco use disorder 12/29/2022    Past Surgical History:  Procedure Laterality Date   BREAST DUCTAL SYSTEM EXCISION Bilateral 10/31/2019   Procedure: BILATERAL BREAST  DUCTAL EXCISION;  Surgeon: Vernetta Berg, MD;  Location: [facility];  Service: General;  Laterality: Bilateral;  LMA   CHOLECYSTECTOMY     MASS EXCISION Right 08/19/2018   Procedure: EXCISION CHRONIC RIGHT BREAST ABSCESS;  Surgeon: Vernetta Berg, MD;  Location: [facility];  Service: General;  Laterality: Right;   PILONIDAL CYST EXCISION     WISDOM TOOTH EXTRACTION      Family History  Problem Relation Age of Onset   Thyroid disease Mother        Pituitary tumor   Kidney disease Mother        renal tumor- benign   Heart disease Father    Diabetes Father    Thyroid disease Sister    Diabetes Paternal Aunt    Diabetes Paternal Uncle    Diabetes Maternal Grandmother    Colon cancer Neg Hx    Rectal cancer Neg Hx    Stomach cancer Neg Hx     Social History   Socioeconomic History   Marital status: Significant Other    Spouse name: Not on file   Number of children: 1   Years of education: Not on file   Highest education level: Not on file  Occupational History   Not on file  Tobacco Use   Smoking status: Every Day    Current packs/day: 0.50    Average packs/day: 0.5 packs/day for 24.0 years (12.0 ttl pk-yrs)    Types: Cigarettes   Smokeless tobacco: Never  Vaping Use   Vaping status: Never Used  Substance  and Sexual Activity   Alcohol use: Yes    Alcohol/week: 1.0 standard drink of alcohol    Types: 1 Glasses of wine per week    Comment: SOCIALLY (TWICE A MONTH)   Drug use: No   Sexual activity: Yes    Birth control/protection: Condom  Other Topics Concern   Not on file  Social History Narrative   Lives with her boyfriend and daughter    Social Drivers of Health   Financial Resource Strain: Not on file  Food Insecurity: No Food Insecurity (06/03/2023)   Hunger Vital Sign    Worried About Running Out of Food in the Last Year: Never true    Ran Out of Food in the Last Year: Never true  Transportation Needs: No Transportation Needs (06/03/2023)    PRAPARE - Administrator, Civil Service (Medical): No    Lack of Transportation (Non-Medical): No  Physical Activity: Insufficiently Active (10/01/2017)   Exercise Vital Sign    Days of Exercise per Week: 2 days    Minutes of Exercise per Session: 30 min  Stress: Not on file  Social Connections: Unknown (07/23/2021)   [facility]   Social Network    Social Network: Not on file  Intimate Partner Violence: Unknown (06/14/2021)   Received from [facility]   HITS    Physically Hurt: Not on file    Insult or Talk Down To: Not on file    Threaten Physical Harm: Not on file    Scream or Curse: Not on file    Outpatient Medications Prior to Visit  Medication Sig Dispense Refill   phentermine  37.5 MG capsule Take 1 capsule by mouth once daily 30 capsule 0   cyclobenzaprine  (FLEXERIL ) 10 MG tablet Take 1 tablet (10 mg total) by mouth 3 (three) times daily as needed for muscle spasms. (Patient not taking: Reported on 12/09/2023) 10 tablet 0   fluticasone  (FLONASE ) 50 MCG/ACT nasal spray Place 2 sprays into both nostrils daily. (Patient not taking: Reported on 12/09/2023) 16 g 0   levocetirizine (XYZAL ) 5 MG tablet Take 1 tablet (5 mg total) by mouth every evening. (Patient not taking: Reported on 12/09/2023) 30 tablet 01   rizatriptan  (MAXALT ) 10 MG tablet Take 1 tablet (10 mg total) by mouth as needed for migraine. May repeat in 2 hours if needed, not to take more than two times in a day. (Patient not taking: Reported on 12/09/2023) 10 tablet 0   No facility-administered medications prior to visit.    Allergies  Allergen Reactions   Penicillins Other (See Comments)    Did it involve swelling of the face/tongue/throat, SOB, or low BP? Unknown Did it involve sudden or severe rash/hives, skin peeling, or any reaction on the inside of your mouth or nose? Unknown Did you need to seek medical attention at a hospital or doctor's office? Unknown When did it last  happen?childhood reaction If all above answers are "NO", may proceed with cephalosporin use.     ROS Review of Systems  Constitutional:  Negative for appetite change, chills, fatigue and fever.  HENT:  Negative for congestion, postnasal drip, rhinorrhea and sneezing.   Respiratory:  Negative for cough, shortness of breath and wheezing.   Cardiovascular:  Negative for chest pain, palpitations and leg swelling.  Gastrointestinal:  Negative for abdominal pain, constipation, nausea and vomiting.  Genitourinary:  Negative for difficulty urinating, dysuria, flank pain and frequency.  Musculoskeletal:  Negative for arthralgias, back pain, joint swelling  and myalgias.  Skin:  Positive for rash. Negative for color change and pallor.  Neurological:  Negative for dizziness, facial asymmetry, weakness, numbness and headaches.  Psychiatric/Behavioral:  Negative for behavioral problems, confusion, self-injury and suicidal ideas.       Objective:    Physical Exam Vitals and nursing note reviewed. Exam conducted with a chaperone present.  Constitutional:      General: She is not in acute distress.    Appearance: Normal appearance. She is obese. She is not ill-appearing, toxic-appearing or diaphoretic.  Eyes:     General: No scleral icterus.       Right eye: No discharge.        Left eye: No discharge.     Extraocular Movements: Extraocular movements intact.     Conjunctiva/sclera: Conjunctivae normal.  Cardiovascular:     Rate and Rhythm: Normal rate and regular rhythm.     Pulses: Normal pulses.     Heart sounds: Normal heart sounds. No murmur heard.    No friction rub. No gallop.  Pulmonary:     Effort: Pulmonary effort is normal. No respiratory distress.     Breath sounds: Normal breath sounds. No stridor. No wheezing, rhonchi or rales.  Chest:     Chest wall: No tenderness.  Abdominal:     General: There is no distension.     Palpations: Abdomen is soft.     Tenderness: There is no  abdominal tenderness. There is no right CVA tenderness, left CVA tenderness or guarding.  Musculoskeletal:        General: No swelling, tenderness, deformity or signs of injury.     Right lower leg: No edema.     Left lower leg: No edema.  Skin:    General: Skin is warm and dry.     Capillary Refill: Capillary refill takes less than 2 seconds.     Coloration: Skin is not jaundiced or pale.     Findings: Lesion present. No bruising or erythema.     Comments: Chronic skin changes consistent with hidradenitis noted on the breasts, abdomen and axillary area  Neurological:     Mental Status: She is alert and oriented to person, place, and time.     Motor: No weakness.     Coordination: Coordination normal.     Gait: Gait normal.  Psychiatric:        Mood and Affect: Mood normal.        Behavior: Behavior normal.        Thought Content: Thought content normal.        Judgment: Judgment normal.     BP 127/66   Pulse 79   Wt 254 lb (115.2 kg)   SpO2 100%   BMI 38.62 kg/m  Wt Readings from Last 3 Encounters:  12/09/23 254 lb (115.2 kg)  09/08/23 248 lb (112.5 kg)  06/03/23 253 lb 12.8 oz (115.1 kg)    Lab Results  Component Value Date   TSH 1.080 11/26/2021   Lab Results  Component Value Date   WBC 5.0 01/26/2023   HGB 13.6 01/26/2023   HCT 40.9 01/26/2023   MCV 92 01/26/2023   PLT 302 01/26/2023   Lab Results  Component Value Date   NA 141 01/26/2023   K 4.1 01/26/2023   CO2 23 01/26/2023   GLUCOSE 90 01/26/2023   BUN 12 01/26/2023   CREATININE 0.88 01/26/2023   BILITOT 0.3 01/26/2023   ALKPHOS 55 01/26/2023   AST 12 01/26/2023  ALT 10 01/26/2023   PROT 6.2 01/26/2023   ALBUMIN 3.9 01/26/2023   CALCIUM  9.1 01/26/2023   EGFR 80 01/26/2023   Lab Results  Component Value Date   CHOL 163 01/26/2023   Lab Results  Component Value Date   HDL 64 01/26/2023   Lab Results  Component Value Date   LDLCALC 87 01/26/2023   Lab Results  Component Value Date    TRIG 58 01/26/2023   Lab Results  Component Value Date   CHOLHDL 2.5 01/26/2023   Lab Results  Component Value Date   HGBA1C 5.1 06/29/2019      Assessment & Plan:   Problem List Items Addressed This Visit       Musculoskeletal and Integument   Hidradenitis suppurativa    Hidradenitis suppurativa with no  flare-ups currently. Advised dermatologist evaluation for appropriate management. - Refer to a dermatologist for evaluation and management of hidradenitis suppurativa.         Other   Class 3 obesity (HCC) - Primary   Wt Readings from Last 3 Encounters:  12/09/23 254 lb (115.2 kg)  09/08/23 248 lb (112.5 kg)  06/03/23 253 lb 12.8 oz (115.1 kg)   Body mass index is 38.62 kg/m.  She has added 6 pounds since her last visit Class 3 obesity with recent weight gain. Phentermine  used intermittently without significant weight loss. Discussed diet, exercise, and potential transition to [facility] for long-term management. Emphasized lifestyle changes for sustained weight loss. - Phentermine  discontinued due to her  lack of weight loss benefit from the medication - Refer to a dietitian for nutritional counseling. - Encourage reduction of carbohydrate intake, especially breads and sodas. - Advise increasing vegetable and protein intake. - Encourage regular exercise at least 30 minutes 5 days a week  - Discuss potential transition to Contrave after making lifestyle changes  Follow-up in 4 months          Relevant Orders   Amb Ref to Medical Weight Management   Need for influenza vaccination   Screening mammogram for breast cancer   Relevant Orders   MM 3D SCREENING MAMMOGRAM BILATERAL BREAST    No orders of the defined types were placed in this encounter.   Follow-up: Return in about 4 months (around 04/10/2024) for CPE.    Berdene Askari R Akeela Busk, FNP

## 2023-12-10 NOTE — Addendum Note (Signed)
 Addended by: VICTORY IHA on: 12/10/2023 03:55 PM   Modules accepted: Orders

## 2024-03-25 ENCOUNTER — Ambulatory Visit (HOSPITAL_COMMUNITY)
Admission: EM | Admit: 2024-03-25 | Discharge: 2024-03-25 | Disposition: A | Discharge status: Home / Self Care | Attending: Emergency Medicine | Admitting: Emergency Medicine

## 2024-03-25 ENCOUNTER — Encounter (HOSPITAL_COMMUNITY): Payer: Self-pay

## 2024-03-25 DIAGNOSIS — L02213 Cutaneous abscess of chest wall: Secondary | ICD-10-CM | POA: Diagnosis not present

## 2024-03-25 MED ORDER — DOXYCYCLINE HYCLATE 100 MG PO CAPS: 100.0000 mg | ORAL_CAPSULE | Freq: Two times a day (BID) | ORAL | 0 refills | Status: DC

## 2024-03-25 NOTE — ED Provider Notes (Signed)
 " [facility]    CSN: [id number] Arrival date & time: 03/25/24  1810      History   Chief Complaint Chief Complaint  Patient presents with   Abscess    HPI Lorraine [name] is a 54 y.o. female.   Patient presents with concerns for abscess to the right side of her chest wall that began 3 days ago.  Patient states that it started out as a small bump and has become larger and more painful over the last few days.  Patient states that the area opened up and started draining today.  Patient denies any fever.  Patient does have a history of hidradenitis suppurativa.  Patient reports that she was provided a referral by her primary care provider in October but has not heard anything regarding this referral since.  The history is provided by the patient and medical records.  Abscess   Past Medical History:  Diagnosis Date   Allergy    Seasonal   Class 3 obesity (HCC) 04/02/2011   Hidradenitis suppurativa 04/02/2011   Seasonal allergies    SVD (spontaneous vaginal delivery)    x 1   Tobacco use disorder 12/29/2022    Patient Active Problem List   Diagnosis Date Noted   Need for pneumococcal 20-valent conjugate vaccination 06/03/2023   Acute midline low back pain without sciatica 02/23/2023   Need for shingles vaccine 01/26/2023   Annual physical exam 01/26/2023   Need for influenza vaccination 12/29/2022   Tobacco use disorder 12/29/2022   Irregular menses 12/29/2022   Screening mammogram for breast cancer 12/29/2022   Nipple discharge 12/29/2022   Missed menses 12/08/2021   Dyshidrotic eczema 10/28/2021   Subacute cough 10/28/2021   Tobacco abuse counseling 03/29/2021   Encounter for medical examination to establish care 12/28/2020   Weight gain 12/28/2020   Vaginal odor 12/28/2020   Nasal turbinate hypertrophy 11/10/2018   Contraceptive management 04/07/2012   Hidradenitis suppurativa 04/02/2011   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;  Service: General;  Laterality: Bilateral;  LMA   CHOLECYSTECTOMY     MASS EXCISION Right 08/19/2018   Procedure: EXCISION CHRONIC RIGHT BREAST ABSCESS;  Surgeon: Vernetta Berg, MD;  Location: [facility];  Service: General;  Laterality: Right;   PILONIDAL CYST EXCISION     WISDOM TOOTH EXTRACTION      OB History   No obstetric history on file.      Home Medications    Prior to Admission medications  Medication Sig Start Date End Date Taking? Authorizing Provider  doxycycline  (VIBRAMYCIN ) 100 MG capsule Take 1 capsule (100 mg total) by mouth 2 (two) times daily. 03/25/24  Yes Johnie, Braxley Balandran A, NP  fluticasone  (FLONASE ) 50 MCG/ACT nasal spray Place 2 sprays into both nostrils daily. Patient not taking: Reported on 12/09/2023 12/01/22   Moishe Chiquita HERO, NP  levocetirizine (XYZAL ) 5 MG tablet Take 1 tablet (5 mg total) by mouth every evening. Patient not taking: Reported on 12/09/2023 12/01/22   Moishe Chiquita HERO, NP    Family History Family History  Problem Relation Age of Onset   Thyroid disease Mother        Pituitary tumor   Kidney disease Mother  renal tumor- benign   Heart disease Father    Diabetes Father    Thyroid disease Sister    Diabetes Paternal Aunt    Diabetes Paternal Uncle    Diabetes Maternal Grandmother    Colon cancer Neg Hx    Rectal cancer Neg Hx    Stomach cancer Neg Hx     Social History Social History[1]   Allergies   Penicillins   Review of Systems Review of Systems  Per HPI  Physical Exam Triage Vital Signs ED Triage Vitals  Encounter Vitals Group     BP 03/25/24 1905 (!) 151/89     Girls Systolic BP Percentile --      Girls Diastolic BP Percentile --      Boys Systolic  BP Percentile --      Boys Diastolic BP Percentile --      Pulse Rate 03/25/24 1905 91     Resp 03/25/24 1905 16     Temp 03/25/24 1905 98.7 F (37.1 C)     Temp Source 03/25/24 1905 Oral     SpO2 03/25/24 1905 97 %     Weight --      Height --      Head Circumference --      Peak Flow --      Pain Score 03/25/24 1902 8     Pain Loc --      Pain Education --      Exclude from Growth Chart --    No data found.  Updated Vital Signs BP (!) 151/89 (BP Location: Left Arm)   Pulse 91   Temp 98.7 F (37.1 C) (Oral)   Resp 16   LMP 01/13/2024 (Approximate)   SpO2 97%   Visual Acuity Right Eye Distance:   Left Eye Distance:   Bilateral Distance:    Right Eye Near:   Left Eye Near:    Bilateral Near:     Physical Exam Vitals and nursing note reviewed.  Constitutional:      General: She is awake. She is not in acute distress.    Appearance: Normal appearance. She is well-developed and well-groomed. She is not ill-appearing.  Skin:    General: Skin is warm and dry.     Findings: Abscess present.         Comments: Actively draining abscess with copious amount of purulent drainage noted to right lateral chest wall with surrounding induration measuring approximately 4 x 4 cm  Neurological:     Mental Status: She is alert.  Psychiatric:        Behavior: Behavior is cooperative.      UC Treatments / Results  Labs (all labs ordered are listed, but only abnormal results are displayed) Labs Reviewed - No data to display  EKG   Radiology No results found.  Procedures Procedures (including critical care time)  Medications Ordered in UC Medications - No data to display  Initial Impression / Assessment and Plan / UC Course  I have reviewed the triage vital signs and the nursing notes.  Pertinent labs & imaging results that were available during my care of the patient were reviewed by me and considered in my medical decision making (see chart for details).      Patient is overall well-appearing.  Vitals are stable.  Prescribed doxycycline  for abscess coverage.  Recommended warm compresses.  Provided amatory referral to dermatology.  Discussed follow-up and return precautions. Final Clinical Impressions(s) / UC Diagnoses   Final diagnoses:  Chest wall abscess     Discharge Instructions      Starting doxycycline  twice daily for 10 days for abscess coverage. Apply warm compresses to help promote drainage from the area. I have provided you with an ambulatory referral to dermatology.  If you do not hear from them within 1 to 2 [place] give them a call to schedule appointment. Otherwise follow-up with your primary care provider or return here as needed.   ED Prescriptions     Medication Sig Dispense Auth. Provider   doxycycline  (VIBRAMYCIN ) 100 MG capsule Take 1 capsule (100 mg total) by mouth 2 (two) times daily. 20 capsule Johnie Flaming A, NP      PDMP not reviewed this encounter.    [1]  Social History Tobacco Use   Smoking status: Every Day    Current packs/day: 0.50    Average packs/day: 0.5 packs/day for 24.0 years (12.0 ttl pk-yrs)    Types: Cigarettes   Smokeless tobacco: Never  Vaping Use   Vaping status: Never Used  Substance Use Topics   Alcohol use: Yes    Alcohol/week: 1.0 standard drink of alcohol    Types: 1 Glasses of wine per week    Comment: SOCIALLY (TWICE A MONTH)   Drug use: No     Johnie Flaming LABOR, NP 03/25/24 2002  "

## 2024-03-25 NOTE — Discharge Instructions (Signed)
 Starting doxycycline  twice daily for 10 days for abscess coverage. Apply warm compresses to help promote drainage from the area. I have provided you with an ambulatory referral to dermatology.  If you do not hear from them within 1 to 2 [place] give them a call to schedule appointment. Otherwise follow-up with your primary care provider or return here as needed.

## 2024-03-25 NOTE — ED Triage Notes (Signed)
 Patient here today with c/o abscess on her with side torso X 3 days. Patient states that she had a bump there for a while now but just started swelling and becoming more painful.

## 2024-04-14 ENCOUNTER — Ambulatory Visit: Admitting: Physician Assistant

## 2024-04-14 ENCOUNTER — Encounter: Payer: Self-pay | Admitting: Physician Assistant

## 2024-04-14 VITALS — BP 127/86

## 2024-04-14 DIAGNOSIS — L732 Hidradenitis suppurativa: Secondary | ICD-10-CM

## 2024-04-14 DIAGNOSIS — Z720 Tobacco use: Secondary | ICD-10-CM

## 2024-04-14 NOTE — Patient Instructions (Addendum)

## 2024-04-14 NOTE — Progress Notes (Signed)
" ° °  Follow Up Visit   Subjective  Lorraine [name] is a 54 y.o. female NEW PATIENT who presents for the following: bumps under her breasts, under her arms and in her groin area. She has had this condition since she was a teenager but seems to be getting worse. She was recently on doxycycline  for an inflamed cyst. Everyday smoker.     The following portions of the chart were reviewed this encounter and updated as appropriate: medications, allergies, medical history  Review of Systems:  No other skin or systemic complaints except as noted in HPI or Assessment and Plan.  Objective  Well appearing patient in no apparent distress; mood and affect are within normal limits.  A focused examination was performed of the following areas: Inframammary areas, axillary areas, groin areas  Relevant exam findings are noted in the Assessment and Plan.    Assessment & Plan   TOBACCO USER   HIDRADENITIS SUPPURATIVA    HIDRADENITIS SUPPURATIVA -- AXILLA, INFRA-MAMMARY, INGUINAL  Exam: comedones, scars and resolving papules   Hidradenitis Suppurativa is a chronic; persistent; non-curable, but treatable condition due to abnormal inflamed sweat glands in the body folds (axilla, inframammary, groin, medial thighs), causing recurrent painful draining cysts and scarring. It can be associated with severe scarring acne and cysts; also abscesses and scarring of scalp. The goal is control and prevention of flares, as it is not curable. Scars are permanent and can be thickened. Treatment may include daily use of topical medication and oral antibiotics.  Oral isotretinoin may also be helpful.  For some cases, Humira or Cosentyx (biologic injections) may be prescribed to decrease the inflammatory process and prevent flares.  When indicated, inflamed cysts may also be treated surgically.  Treatment Plan: Recommend alternating Dial Antibacterial soap, Hibiclens  and Panoxyl wash to wash affected areas daily.   Advised  patient smoking can exacerbate this condition. Recommend she stop smoking.   Return in about 5 months (around 09/11/2024) for Hidradenitis follow up.  I, Roseline Hutchinson, CMA, am acting as scribe for Kaiyon Hynes K, PA-C .   Documentation: I have reviewed the above documentation for accuracy and completeness, and I agree with the above.  Jackston [place] K, PA-C    "

## 2024-04-20 ENCOUNTER — Encounter: Payer: Self-pay | Admitting: Nurse Practitioner

## 2024-09-13 ENCOUNTER — Ambulatory Visit: Admitting: Physician Assistant
